# Patient Record
Sex: Female | Born: 1975 | Hispanic: No
Health system: Western US, Academic
[De-identification: ages and names within clinical notes are randomized; demographics above are authoritative.]

## PROBLEM LIST (undated history)

## (undated) DIAGNOSIS — D649 Anemia, unspecified: Secondary | ICD-10-CM

## (undated) DIAGNOSIS — M549 Dorsalgia, unspecified: Secondary | ICD-10-CM

## (undated) DIAGNOSIS — J45909 Unspecified asthma, uncomplicated: Secondary | ICD-10-CM

## (undated) DIAGNOSIS — I499 Cardiac arrhythmia, unspecified: Secondary | ICD-10-CM

## (undated) DIAGNOSIS — K219 Gastro-esophageal reflux disease without esophagitis: Secondary | ICD-10-CM

## (undated) HISTORY — PX: TUBAL LIGATION: SHX77

---

## 1998-01-23 ENCOUNTER — Emergency Department (HOSPITAL_COMMUNITY): Admission: EM | Admit: 1998-01-23 | Discharge: 1998-01-23 | Payer: Self-pay | Admitting: Emergency Medicine

## 1998-11-16 ENCOUNTER — Emergency Department (HOSPITAL_COMMUNITY): Admission: EM | Admit: 1998-11-16 | Discharge: 1998-11-17 | Payer: Self-pay | Admitting: Emergency Medicine

## 2002-05-22 ENCOUNTER — Emergency Department (HOSPITAL_COMMUNITY): Admission: EM | Admit: 2002-05-22 | Discharge: 2002-05-22 | Payer: Self-pay | Admitting: *Deleted

## 2002-07-26 ENCOUNTER — Emergency Department (HOSPITAL_COMMUNITY): Admission: EM | Admit: 2002-07-26 | Discharge: 2002-07-26 | Payer: Self-pay | Admitting: Emergency Medicine

## 2002-07-26 ENCOUNTER — Encounter: Payer: Self-pay | Admitting: Emergency Medicine

## 2002-08-07 ENCOUNTER — Emergency Department (HOSPITAL_COMMUNITY): Admission: EM | Admit: 2002-08-07 | Discharge: 2002-08-07 | Payer: Self-pay | Admitting: Emergency Medicine

## 2002-10-16 ENCOUNTER — Emergency Department (HOSPITAL_COMMUNITY): Admission: EM | Admit: 2002-10-16 | Discharge: 2002-10-16 | Payer: Self-pay | Admitting: Emergency Medicine

## 2002-12-10 ENCOUNTER — Emergency Department (HOSPITAL_COMMUNITY): Admission: EM | Admit: 2002-12-10 | Discharge: 2002-12-10 | Payer: Self-pay | Admitting: Emergency Medicine

## 2003-01-14 ENCOUNTER — Emergency Department (HOSPITAL_COMMUNITY): Admission: EM | Admit: 2003-01-14 | Discharge: 2003-01-14 | Payer: Self-pay | Admitting: Emergency Medicine

## 2003-08-01 ENCOUNTER — Emergency Department (HOSPITAL_COMMUNITY): Admission: EM | Admit: 2003-08-01 | Discharge: 2003-08-01 | Payer: Self-pay | Admitting: Emergency Medicine

## 2003-08-03 ENCOUNTER — Emergency Department (HOSPITAL_COMMUNITY): Admission: EM | Admit: 2003-08-03 | Discharge: 2003-08-03 | Payer: Self-pay | Admitting: Emergency Medicine

## 2003-10-09 ENCOUNTER — Encounter: Admission: RE | Admit: 2003-10-09 | Discharge: 2003-10-20 | Payer: Self-pay | Admitting: Orthopedic Surgery

## 2004-01-24 ENCOUNTER — Emergency Department (HOSPITAL_COMMUNITY): Admission: EM | Admit: 2004-01-24 | Discharge: 2004-01-24 | Payer: Self-pay | Admitting: Emergency Medicine

## 2004-11-04 ENCOUNTER — Emergency Department (HOSPITAL_COMMUNITY): Admission: EM | Admit: 2004-11-04 | Discharge: 2004-11-04 | Payer: Self-pay | Admitting: Emergency Medicine

## 2005-03-27 ENCOUNTER — Emergency Department (HOSPITAL_COMMUNITY): Admission: EM | Admit: 2005-03-27 | Discharge: 2005-03-27 | Payer: Self-pay | Admitting: Emergency Medicine

## 2005-04-06 ENCOUNTER — Emergency Department (HOSPITAL_COMMUNITY): Admission: EM | Admit: 2005-04-06 | Discharge: 2005-04-06 | Payer: Self-pay | Admitting: Emergency Medicine

## 2005-09-29 ENCOUNTER — Emergency Department (HOSPITAL_COMMUNITY): Admission: EM | Admit: 2005-09-29 | Discharge: 2005-09-29 | Payer: Self-pay | Admitting: Pediatrics

## 2006-03-03 ENCOUNTER — Emergency Department (HOSPITAL_COMMUNITY): Admission: EM | Admit: 2006-03-03 | Discharge: 2006-03-03 | Payer: Self-pay | Admitting: Emergency Medicine

## 2006-03-20 ENCOUNTER — Emergency Department (HOSPITAL_COMMUNITY): Admission: EM | Admit: 2006-03-20 | Discharge: 2006-03-20 | Payer: Self-pay | Admitting: Emergency Medicine

## 2007-01-13 ENCOUNTER — Emergency Department (HOSPITAL_COMMUNITY): Admission: EM | Admit: 2007-01-13 | Discharge: 2007-01-13 | Payer: Self-pay | Admitting: Emergency Medicine

## 2007-02-07 ENCOUNTER — Emergency Department (HOSPITAL_COMMUNITY): Admission: EM | Admit: 2007-02-07 | Discharge: 2007-02-07 | Payer: Self-pay | Admitting: Emergency Medicine

## 2008-01-18 ENCOUNTER — Emergency Department (HOSPITAL_COMMUNITY): Admission: EM | Admit: 2008-01-18 | Discharge: 2008-01-18 | Payer: Self-pay | Admitting: Emergency Medicine

## 2008-06-24 ENCOUNTER — Emergency Department (HOSPITAL_COMMUNITY): Admission: EM | Admit: 2008-06-24 | Discharge: 2008-06-24 | Payer: Self-pay | Admitting: Emergency Medicine

## 2008-07-29 ENCOUNTER — Emergency Department (HOSPITAL_COMMUNITY): Admission: EM | Admit: 2008-07-29 | Discharge: 2008-07-29 | Payer: Self-pay | Admitting: Emergency Medicine

## 2008-08-15 ENCOUNTER — Emergency Department (HOSPITAL_COMMUNITY): Admission: EM | Admit: 2008-08-15 | Discharge: 2008-08-15 | Payer: Self-pay | Admitting: Family Medicine

## 2009-03-21 ENCOUNTER — Emergency Department (HOSPITAL_COMMUNITY): Admission: EM | Admit: 2009-03-21 | Discharge: 2009-03-21 | Payer: Self-pay | Admitting: Emergency Medicine

## 2009-04-14 ENCOUNTER — Emergency Department (HOSPITAL_COMMUNITY): Admission: EM | Admit: 2009-04-14 | Discharge: 2009-04-14 | Payer: Self-pay | Admitting: Emergency Medicine

## 2009-04-19 ENCOUNTER — Emergency Department (HOSPITAL_COMMUNITY): Admission: EM | Admit: 2009-04-19 | Discharge: 2009-04-19 | Payer: Self-pay | Admitting: Emergency Medicine

## 2009-08-09 ENCOUNTER — Emergency Department (HOSPITAL_COMMUNITY): Admission: EM | Admit: 2009-08-09 | Discharge: 2009-08-09 | Payer: Self-pay | Admitting: Emergency Medicine

## 2010-06-02 ENCOUNTER — Emergency Department (HOSPITAL_COMMUNITY)
Admission: EM | Admit: 2010-06-02 | Discharge: 2010-06-02 | Payer: Self-pay | Source: Home / Self Care | Admitting: Emergency Medicine

## 2010-09-17 ENCOUNTER — Emergency Department (HOSPITAL_COMMUNITY)
Admission: EM | Admit: 2010-09-17 | Discharge: 2010-09-17 | Payer: Self-pay | Source: Home / Self Care | Admitting: Emergency Medicine

## 2010-11-02 ENCOUNTER — Emergency Department (HOSPITAL_COMMUNITY)
Admission: EM | Admit: 2010-11-02 | Discharge: 2010-11-02 | Disposition: A | Discharge status: Home / Self Care | Payer: Self-pay | Attending: Emergency Medicine | Admitting: Emergency Medicine

## 2010-11-02 DIAGNOSIS — J45901 Unspecified asthma with (acute) exacerbation: Secondary | ICD-10-CM | POA: Insufficient documentation

## 2010-11-02 DIAGNOSIS — R0989 Other specified symptoms and signs involving the circulatory and respiratory systems: Secondary | ICD-10-CM | POA: Insufficient documentation

## 2010-11-02 DIAGNOSIS — R0609 Other forms of dyspnea: Secondary | ICD-10-CM | POA: Insufficient documentation

## 2010-11-02 DIAGNOSIS — R0602 Shortness of breath: Secondary | ICD-10-CM | POA: Insufficient documentation

## 2010-11-22 ENCOUNTER — Emergency Department (HOSPITAL_COMMUNITY)
Admission: EM | Admit: 2010-11-22 | Discharge: 2010-11-22 | Disposition: A | Discharge status: Home / Self Care | Payer: Self-pay | Attending: Emergency Medicine | Admitting: Emergency Medicine

## 2010-11-22 DIAGNOSIS — J45909 Unspecified asthma, uncomplicated: Secondary | ICD-10-CM | POA: Insufficient documentation

## 2010-11-22 DIAGNOSIS — R0609 Other forms of dyspnea: Secondary | ICD-10-CM | POA: Insufficient documentation

## 2010-11-22 DIAGNOSIS — R0989 Other specified symptoms and signs involving the circulatory and respiratory systems: Secondary | ICD-10-CM | POA: Insufficient documentation

## 2010-11-22 DIAGNOSIS — R0789 Other chest pain: Secondary | ICD-10-CM | POA: Insufficient documentation

## 2010-11-23 LAB — COMPREHENSIVE METABOLIC PANEL
ALT: 14 U/L (ref 0–35)
AST: 21 U/L (ref 0–37)
Albumin: 3.3 g/dL — ABNORMAL LOW (ref 3.5–5.2)
BUN: 7 mg/dL (ref 6–23)
CO2: 31 mEq/L (ref 19–32)
Calcium: 8.5 mg/dL (ref 8.4–10.5)
Chloride: 103 mEq/L (ref 96–112)
Creatinine, Ser: 0.97 mg/dL (ref 0.4–1.2)
GFR calc Af Amer: >60 mL/min (ref >60)
Potassium: 3.7 mEq/L (ref 3.5–5.1)
Sodium: 140 mEq/L (ref 135–145)
Total Protein: 6.6 g/dL (ref 6.0–8.3)

## 2010-11-23 LAB — URINE MICROSCOPIC-ADD ON

## 2010-11-23 LAB — CBC
Platelets: 343 10*3/uL (ref 150–400)
RDW: 12.7 % (ref 11.5–15.5)
WBC: 6.1 10*3/uL (ref 4.0–10.5)

## 2010-11-23 LAB — URINALYSIS, ROUTINE W REFLEX MICROSCOPIC
Leukocytes, UA: NEGATIVE
Nitrite: NEGATIVE
Specific Gravity, Urine: 1.016 (ref 1.005–1.030)
pH: 6 (ref 5.0–8.0)

## 2010-11-23 LAB — DIFFERENTIAL
Basophils Relative: 1 % (ref 0–1)
Eosinophils Absolute: 0.4 10*3/uL (ref 0.0–0.7)
Lymphs Abs: 1.9 10*3/uL (ref 0.7–4.0)
Neutro Abs: 3.2 10*3/uL (ref 1.7–7.7)
Neutrophils Relative %: 52 % (ref 43–77)

## 2010-11-23 LAB — POCT PREGNANCY, URINE: Preg Test, Ur: NEGATIVE

## 2011-01-08 ENCOUNTER — Emergency Department (HOSPITAL_COMMUNITY)
Admission: EM | Admit: 2011-01-08 | Discharge: 2011-01-08 | Disposition: A | Discharge status: Home / Self Care | Payer: Self-pay | Attending: Emergency Medicine | Admitting: Emergency Medicine

## 2011-01-08 DIAGNOSIS — R05 Cough: Secondary | ICD-10-CM | POA: Insufficient documentation

## 2011-01-08 DIAGNOSIS — J45909 Unspecified asthma, uncomplicated: Secondary | ICD-10-CM | POA: Insufficient documentation

## 2011-01-08 DIAGNOSIS — R059 Cough, unspecified: Secondary | ICD-10-CM | POA: Insufficient documentation

## 2011-01-10 ENCOUNTER — Emergency Department (HOSPITAL_COMMUNITY): Payer: Self-pay

## 2011-01-10 ENCOUNTER — Emergency Department (HOSPITAL_COMMUNITY)
Admission: EM | Admit: 2011-01-10 | Discharge: 2011-01-10 | Disposition: A | Discharge status: Home / Self Care | Payer: Self-pay | Attending: Emergency Medicine | Admitting: Emergency Medicine

## 2011-01-10 DIAGNOSIS — R0602 Shortness of breath: Secondary | ICD-10-CM | POA: Insufficient documentation

## 2011-01-10 DIAGNOSIS — J45909 Unspecified asthma, uncomplicated: Secondary | ICD-10-CM | POA: Insufficient documentation

## 2011-02-03 ENCOUNTER — Emergency Department (HOSPITAL_COMMUNITY)
Admission: EM | Admit: 2011-02-03 | Discharge: 2011-02-03 | Disposition: A | Discharge status: Home / Self Care | Payer: Self-pay | Attending: Emergency Medicine | Admitting: Emergency Medicine

## 2011-02-03 DIAGNOSIS — J45901 Unspecified asthma with (acute) exacerbation: Secondary | ICD-10-CM | POA: Insufficient documentation

## 2011-02-23 ENCOUNTER — Emergency Department (HOSPITAL_COMMUNITY): Payer: Self-pay

## 2011-02-23 ENCOUNTER — Emergency Department (HOSPITAL_COMMUNITY)
Admission: EM | Admit: 2011-02-23 | Discharge: 2011-02-23 | Disposition: A | Discharge status: Home / Self Care | Payer: Self-pay | Attending: Emergency Medicine | Admitting: Emergency Medicine

## 2011-02-23 DIAGNOSIS — R0602 Shortness of breath: Secondary | ICD-10-CM | POA: Insufficient documentation

## 2011-02-23 DIAGNOSIS — J45909 Unspecified asthma, uncomplicated: Secondary | ICD-10-CM | POA: Insufficient documentation

## 2011-02-25 ENCOUNTER — Emergency Department (HOSPITAL_COMMUNITY)
Admission: EM | Admit: 2011-02-25 | Discharge: 2011-02-25 | Disposition: A | Discharge status: Home / Self Care | Payer: No Typology Code available for payment source | Attending: Emergency Medicine | Admitting: Emergency Medicine

## 2011-02-25 DIAGNOSIS — S1093XA Contusion of unspecified part of neck, initial encounter: Secondary | ICD-10-CM | POA: Insufficient documentation

## 2011-02-25 DIAGNOSIS — Y9269 Other specified industrial and construction area as the place of occurrence of the external cause: Secondary | ICD-10-CM | POA: Insufficient documentation

## 2011-02-25 DIAGNOSIS — T148XXA Other injury of unspecified body region, initial encounter: Secondary | ICD-10-CM | POA: Insufficient documentation

## 2011-02-25 DIAGNOSIS — S0003XA Contusion of scalp, initial encounter: Secondary | ICD-10-CM | POA: Insufficient documentation

## 2011-05-15 LAB — BASIC METABOLIC PANEL
BUN: 11
Creatinine, Ser: 0.9
GFR calc non Af Amer: >60
Glucose, Bld: 88
Potassium: 4

## 2011-05-15 LAB — CBC
Hemoglobin: 12.9
Platelets: 383
RBC: 4.05
RDW: 12.8

## 2011-05-15 LAB — URINALYSIS, ROUTINE W REFLEX MICROSCOPIC
Glucose, UA: NEGATIVE
Hgb urine dipstick: NEGATIVE
Nitrite: NEGATIVE
Specific Gravity, Urine: 1.025
pH: 5.5

## 2011-05-22 LAB — POCT I-STAT, CHEM 8
Calcium, Ion: 1.07 mmol/L — ABNORMAL LOW (ref 1.12–1.32)
Potassium: 3.5 mEq/L (ref 3.5–5.1)
Sodium: 142 mEq/L (ref 135–145)

## 2011-06-04 LAB — WET PREP, GENITAL
Trich, Wet Prep: NONE SEEN
WBC, Wet Prep HPF POC: NONE SEEN
Yeast Wet Prep HPF POC: NONE SEEN

## 2011-06-04 LAB — GC/CHLAMYDIA PROBE AMP, GENITAL
Chlamydia, DNA Probe: NEGATIVE
GC Probe Amp, Genital: NEGATIVE

## 2013-02-19 ENCOUNTER — Emergency Department (HOSPITAL_COMMUNITY)
Admission: EM | Admit: 2013-02-19 | Discharge: 2013-02-19 | Disposition: A | Discharge status: Home / Self Care | Payer: Self-pay | Attending: Emergency Medicine | Admitting: Emergency Medicine

## 2013-02-19 ENCOUNTER — Encounter (HOSPITAL_COMMUNITY): Payer: Self-pay | Admitting: *Deleted

## 2013-02-19 DIAGNOSIS — J45901 Unspecified asthma with (acute) exacerbation: Secondary | ICD-10-CM | POA: Insufficient documentation

## 2013-02-19 DIAGNOSIS — R05 Cough: Secondary | ICD-10-CM | POA: Insufficient documentation

## 2013-02-19 DIAGNOSIS — Z880 Allergy status to penicillin: Secondary | ICD-10-CM | POA: Insufficient documentation

## 2013-02-19 DIAGNOSIS — Z79899 Other long term (current) drug therapy: Secondary | ICD-10-CM | POA: Insufficient documentation

## 2013-02-19 DIAGNOSIS — R059 Cough, unspecified: Secondary | ICD-10-CM | POA: Insufficient documentation

## 2013-02-19 DIAGNOSIS — R110 Nausea: Secondary | ICD-10-CM | POA: Insufficient documentation

## 2013-02-19 HISTORY — DX: Unspecified asthma, uncomplicated: J45.909

## 2013-02-19 MED ORDER — DEXAMETHASONE SODIUM PHOSPHATE 10 MG/ML IJ SOLN
10.0000 mg | Freq: Once | INTRAMUSCULAR | Status: DC
  Filled 2013-02-19: qty 1

## 2013-02-19 MED ORDER — IPRATROPIUM BROMIDE 0.02 % IN SOLN
0.5000 mg | Freq: Once | RESPIRATORY_TRACT | Status: AC
  Administered 2013-02-19: 0.5 mg via RESPIRATORY_TRACT
  Filled 2013-02-19: qty 2.5

## 2013-02-19 MED ORDER — ALBUTEROL SULFATE HFA 108 (90 BASE) MCG/ACT IN AERS: 2.0000 | INHALATION_SPRAY | RESPIRATORY_TRACT | Status: DC | PRN

## 2013-02-19 MED ORDER — DEXAMETHASONE SODIUM PHOSPHATE 10 MG/ML IJ SOLN
10.0000 mg | Freq: Once | INTRAMUSCULAR | Status: AC
  Administered 2013-02-19: 10 mg via INTRAMUSCULAR

## 2013-02-19 MED ORDER — ALBUTEROL SULFATE (5 MG/ML) 0.5% IN NEBU
5.0000 mg | INHALATION_SOLUTION | Freq: Once | RESPIRATORY_TRACT | Status: AC
  Administered 2013-02-19: 5 mg via RESPIRATORY_TRACT
  Filled 2013-02-19: qty 1

## 2013-02-19 MED ORDER — ALBUTEROL SULFATE HFA 108 (90 BASE) MCG/ACT IN AERS
2.0000 | INHALATION_SPRAY | Freq: Once | RESPIRATORY_TRACT | Status: AC
  Administered 2013-02-19: 2 via RESPIRATORY_TRACT
  Filled 2013-02-19: qty 6.7

## 2013-02-19 NOTE — ED Provider Notes (Signed)
I saw and evaluated the patient, reviewed the resident's note and I agree with the findings and plan.   .Face to face Exam:  General:  Awake HEENT:  Atraumatic Resp:  Normal effort Abd:  Nondistended Neuro:No focal weakness   Nelia Shi, MD 02/19/13 [phone]

## 2013-02-19 NOTE — ED Notes (Signed)
Pt told RRT she was feeling better after breathing treatment.  Pt laughing and talking in full sentences with NAD noted.  Upon MD arrival to room pt states she feels tight in her chest.

## 2013-02-19 NOTE — ED Provider Notes (Signed)
History    CSN: [id number] Arrival date & time 02/19/13  7829  First MD Initiated Contact with Patient 02/19/13 [phone]     Chief Complaint  Patient presents with  . Shortness of Breath   (Consider location/radiation/quality/duration/timing/severity/associated sxs/prior Treatment) HPI Comments: Pt is a 37yo female with hx of asthma who presents with about 24h of cough, SOB, and wheezing after cleaning a dusty/dirty house with "lots of mold" yesterday. Pt also went swimming yesterday which 'stressed her lungs.' Pt reports onset of cough with worsening shortness of breath through the day yesterday and last night and states she was unable to sleep. Cough has had no production of sputum and pt has had no fevers or chest pain. Pt does endorse mild nausea. Pt has tried some deep breathing exercises without much relief. Pt does not have any inhalers at home; she last had one about a month ago but moved back to [place] after living in [place] (where she had insurance). She has used albuterol with relief in the past.  Patient is a 37 y.o. female presenting with shortness of breath. The history is provided by the patient. No language interpreter was used.  Shortness of Breath Severity:  Moderate Onset quality:  Sudden Duration:  1 day Timing:  Constant Progression:  Worsening Chronicity:  Recurrent Context: activity and occupational exposure   Context comment:  Cleaning dirty/dusty house with lots of mold yesterday Relieved by:  Rest Worsened by:  Exertion and activity Ineffective treatments:  Sitting up and lying down Associated symptoms: cough and wheezing   Associated symptoms: no abdominal pain, no chest pain, no fever, no rash, no sore throat, no sputum production and no vomiting   Cough:    Cough characteristics:  Non-productive   Sputum characteristics:  Nondescript   Severity:  Moderate Risk factors comment:  Hx of asthma  Past Medical History  Diagnosis Date  . Asthma     History reviewed. No pertinent past surgical history. No family history on file. History  Substance Use Topics  . Smoking status: Never Smoker   . Smokeless tobacco: Not on file  . Alcohol Use: Yes   OB History   Grav Para Term Preterm Abortions TAB SAB Ect Mult Living                 Review of Systems  Constitutional: Negative for fever, chills and activity change.  HENT: Negative for congestion, sore throat, rhinorrhea and sneezing.   Respiratory: Positive for cough, shortness of breath and wheezing. Negative for sputum production.   Cardiovascular: Negative for chest pain.  Gastrointestinal: Positive for nausea. Negative for vomiting, abdominal pain and diarrhea.  Genitourinary: Negative for dysuria, urgency and frequency.  Skin: Negative for rash.  Neurological: Negative for dizziness and weakness.    Allergies  Penicillins  Home Medications   Current Outpatient Rx  Name  Route  Sig  Dispense  Refill  . albuterol (PROVENTIL HFA;VENTOLIN HFA) 108 (90 BASE) MCG/ACT inhaler   Inhalation   Inhale 2 puffs into the lungs every 6 (six) hours as needed for wheezing.         Marland Kitchen albuterol (PROVENTIL HFA;VENTOLIN HFA) 108 (90 BASE) MCG/ACT inhaler   Inhalation   Inhale 2 puffs into the lungs every 4 (four) hours as needed for wheezing or shortness of breath.   1 Inhaler   1    BP 113/71  Pulse 77  Temp(Src) 99 F (37.2 C)  Resp 22  SpO2 100%  LMP 02/19/2013  Physical Exam  Nursing note and vitals reviewed. Constitutional: She is oriented to person, place, and time. She appears well-developed and well-nourished. She is cooperative.  Non-toxic appearance. No distress.  HENT:  Head: Normocephalic and atraumatic.  Nose: No rhinorrhea or septal deviation.  Mouth/Throat: Uvula is midline and oropharynx is clear and moist.  Eyes: Conjunctivae are normal. Pupils are equal, round, and reactive to light.  Neck: Normal range of motion. Neck supple.  Cardiovascular: Normal  rate, regular rhythm and normal heart sounds.   No murmur heard. Heart sounds distant due to body habitus  Pulmonary/Chest: Effort normal. She has wheezes. She has no rales. She exhibits no tenderness.  Diffuse soft wheeze and protracted expiratory phase  Abdominal: Soft. Bowel sounds are normal. She exhibits no distension. There is no tenderness.  obese  Musculoskeletal: Normal range of motion. She exhibits edema.  Trace-1+, left > right at ankles  Neurological: She is alert and oriented to person, place, and time.  Skin: Skin is warm and dry. She is not diaphoretic.  Psychiatric: She has a normal mood and affect. Her behavior is normal.    ED Course  Procedures (including critical care time)   Date: 02/19/2013  Rate: 69  Rhythm: normal sinus rhythm  QRS Axis: normal  Intervals: normal  ST/T Wave abnormalities: normal  Conduction Disutrbances: none  Narrative Interpretation: normal EKG  Old EKG Reviewed: No prior tracing   0730: Mild/moderate wheezes but equal breath sounds/air movement bilaterally. Doubt PNA so will defer CXR for now. Will treat with Duoneb and Decadron 10 mg IM x1, then reassess.  1610: Some improvement of wheeze objectively after breathing treatment. Complaining of some chest tightness but EKG normal and O2 sat remains >97% on RA, no increased respiratory effort. Repeat Duoneb about 45 minutes after first.  0845: Much subjective improvement s/p Duoneb x2. Exam with cleared lung sounds and improved bilateral air movement.  Labs Reviewed - No data to display No results found. 1. Asthma with exacerbation, unspecified asthma severity     MDM  37yo female with history of asthma, now with mild exacerbation. Treated with Duoneb x 2 and Decadron IM with good relief. Discharged with Rx for albuterol inhaler and instructions to establish care with a primary care physician for long-term management. Red flags that would prompt return to the ED reviewed.  The above  was discussed in its entirety with attending ED physician Dr. Radford Pax.   Bobbye Morton, MD  PGY-2, [facility]  Bobbye Morton, MD 02/19/13 [phone]

## 2013-02-19 NOTE — ED Notes (Signed)
The pt has had diff breathing  Since yesterday when she was cleaning a dusty house.  She has not been able to sleep  Due to her diff breathing all night,  Speaking in full sentences

## 2013-05-22 ENCOUNTER — Emergency Department (HOSPITAL_COMMUNITY): Payer: No Typology Code available for payment source

## 2013-05-22 ENCOUNTER — Emergency Department (HOSPITAL_COMMUNITY)
Admission: EM | Admit: 2013-05-22 | Discharge: 2013-05-22 | Disposition: A | Discharge status: Home / Self Care | Payer: No Typology Code available for payment source | Attending: Emergency Medicine | Admitting: Emergency Medicine

## 2013-05-22 ENCOUNTER — Encounter (HOSPITAL_COMMUNITY): Payer: Self-pay | Admitting: *Deleted

## 2013-05-22 DIAGNOSIS — Z880 Allergy status to penicillin: Secondary | ICD-10-CM | POA: Insufficient documentation

## 2013-05-22 DIAGNOSIS — Z79899 Other long term (current) drug therapy: Secondary | ICD-10-CM | POA: Insufficient documentation

## 2013-05-22 DIAGNOSIS — J45901 Unspecified asthma with (acute) exacerbation: Secondary | ICD-10-CM

## 2013-05-22 DIAGNOSIS — J069 Acute upper respiratory infection, unspecified: Secondary | ICD-10-CM | POA: Insufficient documentation

## 2013-05-22 MED ORDER — ALBUTEROL SULFATE (5 MG/ML) 0.5% IN NEBU
2.5000 mg | INHALATION_SOLUTION | Freq: Once | RESPIRATORY_TRACT | Status: AC
  Administered 2013-05-22: 2.5 mg via RESPIRATORY_TRACT
  Filled 2013-05-22: qty 0.5

## 2013-05-22 MED ORDER — IPRATROPIUM BROMIDE 0.02 % IN SOLN
0.5000 mg | Freq: Once | RESPIRATORY_TRACT | Status: AC
  Administered 2013-05-22: 0.5 mg via RESPIRATORY_TRACT
  Filled 2013-05-22: qty 2.5

## 2013-05-22 MED ORDER — ALBUTEROL SULFATE HFA 108 (90 BASE) MCG/ACT IN AERS
2.0000 | INHALATION_SPRAY | RESPIRATORY_TRACT | Status: DC | PRN
  Administered 2013-05-22: 2 via RESPIRATORY_TRACT
  Filled 2013-05-22: qty 6.7

## 2013-05-22 NOTE — ED Provider Notes (Signed)
CSN: [id number]     Arrival date & time 05/22/13  2044 History   First MD Initiated Contact with Patient 05/22/13 2121     Chief Complaint  Patient presents with  . Shortness of Breath   (Consider location/radiation/quality/duration/timing/severity/associated sxs/prior Treatment) HPI Pt is a 37yo morbidly obese female with hx of asthma c/o gradually worsening SOB associated with URI symptoms x1 week. Reports moderate productive cough with green phlegm.  States she also has noticed increasing chest tightness and SOB.  Reports trying home remedies such as hot showers and children's cough medicine w/o relief.  Has been out of her albuterol inhaler for 4 weeks due to lack of insurance.  Reports she has environmental allergies including grass and pollen that trigger her asthma.  Smoke is another common trigger, which she states she has notice as of recent more people smoking.  States she "just needs some oxygen" and thinks she will be okay.  Reports subjective fever. Denies n/v/d. No sick contacts.  Was recently in [place] and [place] where there was more air pollution. Denies chest pain.   Past Medical History  Diagnosis Date  . Asthma    History reviewed. No pertinent past surgical history. No family history on file. History  Substance Use Topics  . Smoking status: Never Smoker   . Smokeless tobacco: Not on file  . Alcohol Use: Yes   OB History   Grav Para Term Preterm Abortions TAB SAB Ect Mult Living                 Review of Systems  Constitutional: Positive for fever ( "subjective"). Negative for chills, diaphoresis and fatigue.  HENT: Positive for congestion and sore throat.   Respiratory: Positive for cough, chest tightness, shortness of breath and wheezing. Negative for stridor.   Cardiovascular: Negative for chest pain.  Gastrointestinal: Negative for nausea, vomiting and abdominal pain.  All other systems reviewed and are negative.    Allergies  Penicillins  Home Medications    Current Outpatient Rx  Name  Route  Sig  Dispense  Refill  . albuterol (PROVENTIL HFA;VENTOLIN HFA) 108 (90 BASE) MCG/ACT inhaler   Inhalation   Inhale 2 puffs into the lungs every 4 (four) hours as needed for wheezing or shortness of breath.   1 Inhaler   1    BP 129/77  Pulse 81  Temp(Src) 98.5 F (36.9 C) (Oral)  Resp 19  Wt 295 lb (133.811 kg)  SpO2 99%  LMP 04/22/2013 Physical Exam  Nursing note and vitals reviewed. Constitutional: She appears well-developed and well-nourished. No distress.  Morbidly obese female sitting up in exam bed, NAD.  HENT:  Head: Normocephalic and atraumatic.  Eyes: Conjunctivae are normal. No scleral icterus.  Neck: Normal range of motion.  Cardiovascular: Normal rate, regular rhythm and normal heart sounds.   Pulmonary/Chest: Effort normal. No respiratory distress. She has wheezes ( mild diffuse bibasilar wheeze). She has no rales. She exhibits no tenderness.  Mild diffuse wheeze. No respiratory distress. No accessory muscle use. Able to speak in full sentences w/o difficulty.  Abdominal: Soft. Bowel sounds are normal. She exhibits no distension and no mass. There is no tenderness. There is no rebound and no guarding.  Musculoskeletal: Normal range of motion.  Neurological: She is alert.  Skin: Skin is warm and dry. She is not diaphoretic.    ED Course  Procedures (including critical care time) Labs Review Labs Reviewed - No data to display Imaging Review Dg Chest 2  View  05/22/2013   *RADIOLOGY REPORT*  Clinical Data: Worsening shortness of breath and chest congestion.  CHEST - 2 VIEW  Comparison: Chest radiograph performed 02/23/2011  Findings: The lungs are well-aerated and clear.  There is no evidence of focal opacification, pleural effusion or pneumothorax.  The heart is normal in size; the mediastinal contour is within normal limits.  No acute osseous abnormalities are seen.  IMPRESSION: No acute cardiopulmonary process seen.    Original Report Authenticated By: Tonia Ghent, M.D.    MDM   1. Asthma exacerbation   2. URI (upper respiratory infection)    Pt presenting with URI symptoms and asthma exacerbation w/o respiratory distress. No accessory muscle use. Will get CXR and give albuterol and atrovent neb tx then reassess. Vitals: WNL, afebrile, O2 99%  CXR: no acute cardiopulmonary findings  Pt states she feels better after neb tx. Lungs: CTAB.  Rx: albuterol inhaler. Return precautions provided. Pt verbalized understanding and agreement with tx plan.     Junius Finner, PA-C 05/23/13 [phone]

## 2013-05-22 NOTE — ED Notes (Signed)
The pt has asthma and  For 4 weeks she has been out of her albuterol inhaler.  She has a cold and her breathing has been worse.  She has chest congestion also.  No respiratory distress

## 2013-05-23 NOTE — ED Provider Notes (Signed)
Medical screening examination/treatment/procedure(s) were performed by non-physician practitioner and as supervising physician I was immediately available for consultation/collaboration.   William Rykar Lebleu, MD 05/23/13 1840 

## 2013-09-13 ENCOUNTER — Encounter (HOSPITAL_COMMUNITY): Payer: Self-pay | Admitting: Emergency Medicine

## 2013-09-13 ENCOUNTER — Emergency Department (HOSPITAL_COMMUNITY)
Admission: EM | Admit: 2013-09-13 | Discharge: 2013-09-13 | Disposition: A | Discharge status: Home / Self Care | Payer: Self-pay | Attending: Emergency Medicine | Admitting: Emergency Medicine

## 2013-09-13 ENCOUNTER — Emergency Department (HOSPITAL_COMMUNITY): Payer: Self-pay

## 2013-09-13 DIAGNOSIS — N390 Urinary tract infection, site not specified: Secondary | ICD-10-CM | POA: Insufficient documentation

## 2013-09-13 DIAGNOSIS — Z3202 Encounter for pregnancy test, result negative: Secondary | ICD-10-CM | POA: Insufficient documentation

## 2013-09-13 DIAGNOSIS — J45901 Unspecified asthma with (acute) exacerbation: Secondary | ICD-10-CM | POA: Insufficient documentation

## 2013-09-13 DIAGNOSIS — Z880 Allergy status to penicillin: Secondary | ICD-10-CM | POA: Insufficient documentation

## 2013-09-13 DIAGNOSIS — B9789 Other viral agents as the cause of diseases classified elsewhere: Secondary | ICD-10-CM

## 2013-09-13 DIAGNOSIS — Z79899 Other long term (current) drug therapy: Secondary | ICD-10-CM | POA: Insufficient documentation

## 2013-09-13 DIAGNOSIS — J069 Acute upper respiratory infection, unspecified: Secondary | ICD-10-CM | POA: Insufficient documentation

## 2013-09-13 LAB — CBC WITH DIFFERENTIAL/PLATELET
BASOS ABS: 0 10*3/uL (ref 0.0–0.1)
Basophils Relative: 0 % (ref 0–1)
EOS PCT: 4 % (ref 0–5)
Eosinophils Absolute: 0.2 10*3/uL (ref 0.0–0.7)
HCT: 28.6 % — ABNORMAL LOW (ref 36.0–46.0)
Hemoglobin: 9.1 g/dL — ABNORMAL LOW (ref 12.0–15.0)
LYMPHS ABS: 0.7 10*3/uL (ref 0.7–4.0)
LYMPHS PCT: 11 % — AB (ref 12–46)
MCH: 25.6 pg — ABNORMAL LOW (ref 26.0–34.0)
MCHC: 31.8 g/dL (ref 30.0–36.0)
MCV: 80.3 fL (ref 78.0–100.0)
Monocytes Absolute: 0.9 10*3/uL (ref 0.1–1.0)
Monocytes Relative: 16 % — ABNORMAL HIGH (ref 3–12)
NEUTROS PCT: 69 % (ref 43–77)
Neutro Abs: 4.1 10*3/uL (ref 1.7–7.7)
PLATELETS: 385 10*3/uL (ref 150–400)
RBC: 3.56 MIL/uL — AB (ref 3.87–5.11)
RDW: 14.9 % (ref 11.5–15.5)
WBC: 5.9 10*3/uL (ref 4.0–10.5)

## 2013-09-13 LAB — COMPREHENSIVE METABOLIC PANEL
ALK PHOS: 76 U/L (ref 39–117)
ALT: 12 U/L (ref 0–35)
AST: 15 U/L (ref 0–37)
Albumin: 3.2 g/dL — ABNORMAL LOW (ref 3.5–5.2)
BUN: 6 mg/dL (ref 6–23)
CALCIUM: 8.3 mg/dL — AB (ref 8.4–10.5)
CO2: 24 meq/L (ref 19–32)
Chloride: 100 mEq/L (ref 96–112)
Creatinine, Ser: 0.94 mg/dL (ref 0.50–1.10)
GFR calc Af Amer: 87 mL/min — ABNORMAL LOW (ref >90)
GFR, EST NON AFRICAN AMERICAN: 75 mL/min — AB (ref >90)
GLUCOSE: 105 mg/dL — AB (ref 70–99)
POTASSIUM: 3.9 meq/L (ref 3.7–5.3)
SODIUM: 138 meq/L (ref 137–147)
TOTAL PROTEIN: 7.3 g/dL (ref 6.0–8.3)
Total Bilirubin: 0.3 mg/dL (ref 0.3–1.2)

## 2013-09-13 LAB — URINALYSIS, ROUTINE W REFLEX MICROSCOPIC
Glucose, UA: NEGATIVE mg/dL
KETONES UR: 40 mg/dL — AB
Nitrite: POSITIVE — AB
PH: 5 (ref 5.0–8.0)
Protein, ur: 100 mg/dL — AB
SPECIFIC GRAVITY, URINE: 1.026 (ref 1.005–1.030)
Urobilinogen, UA: 1 mg/dL (ref 0.0–1.0)

## 2013-09-13 LAB — URINE MICROSCOPIC-ADD ON

## 2013-09-13 LAB — POCT PREGNANCY, URINE: Preg Test, Ur: NEGATIVE

## 2013-09-13 MED ORDER — ACETAMINOPHEN 325 MG PO TABS
650.0000 mg | ORAL_TABLET | Freq: Once | ORAL | Status: AC
  Administered 2013-09-13: 650 mg via ORAL
  Filled 2013-09-13: qty 2

## 2013-09-13 MED ORDER — ALBUTEROL SULFATE (2.5 MG/3ML) 0.083% IN NEBU
5.0000 mg | INHALATION_SOLUTION | Freq: Once | RESPIRATORY_TRACT | Status: AC
  Administered 2013-09-13: 5 mg via RESPIRATORY_TRACT
  Filled 2013-09-13 (×2): qty 6

## 2013-09-13 MED ORDER — OSELTAMIVIR PHOSPHATE 75 MG PO CAPS: 75.0000 mg | ORAL_CAPSULE | Freq: Two times a day (BID) | ORAL | Status: DC

## 2013-09-13 MED ORDER — BENZONATATE 100 MG PO CAPS: 100.0000 mg | ORAL_CAPSULE | Freq: Three times a day (TID) | ORAL | Status: DC

## 2013-09-13 MED ORDER — ALBUTEROL SULFATE HFA 108 (90 BASE) MCG/ACT IN AERS
2.0000 | INHALATION_SPRAY | Freq: Once | RESPIRATORY_TRACT | Status: AC
  Administered 2013-09-13: 2 via RESPIRATORY_TRACT
  Filled 2013-09-13: qty 6.7

## 2013-09-13 MED ORDER — OSELTAMIVIR PHOSPHATE 75 MG PO CAPS
75.0000 mg | ORAL_CAPSULE | Freq: Once | ORAL | Status: AC
  Administered 2013-09-13: 75 mg via ORAL
  Filled 2013-09-13: qty 1

## 2013-09-13 MED ORDER — IPRATROPIUM BROMIDE 0.02 % IN SOLN
0.5000 mg | Freq: Once | RESPIRATORY_TRACT | Status: AC
  Administered 2013-09-13: 0.5 mg via RESPIRATORY_TRACT
  Filled 2013-09-13 (×2): qty 2.5

## 2013-09-13 MED ORDER — ALBUTEROL SULFATE HFA 108 (90 BASE) MCG/ACT IN AERS: 1.0000 | INHALATION_SPRAY | Freq: Four times a day (QID) | RESPIRATORY_TRACT | Status: DC | PRN

## 2013-09-13 MED ORDER — SULFAMETHOXAZOLE-TRIMETHOPRIM 800-160 MG PO TABS: 1.0000 | ORAL_TABLET | Freq: Two times a day (BID) | ORAL | Status: DC

## 2013-09-13 MED ORDER — SULFAMETHOXAZOLE-TMP DS 800-160 MG PO TABS
1.0000 | ORAL_TABLET | Freq: Once | ORAL | Status: AC
  Administered 2013-09-13: 1 via ORAL
  Filled 2013-09-13: qty 1

## 2013-09-13 NOTE — ED Notes (Signed)
Pt reports fever, productive cough with green colored symptoms, and asthma exacerbation starting yesterday. Pt states she is out of her albuterol.

## 2013-09-13 NOTE — ED Provider Notes (Signed)
CSN: [id number]     Arrival date & time 09/13/13  1121 History   First MD Initiated Contact with Patient 09/13/13 1524     Chief Complaint  Patient presents with  . Asthma  . Abdominal Pain   (Consider location/radiation/quality/duration/timing/severity/associated sxs/prior Treatment) The history is provided by the patient and medical records.   This is a 38 y.o. F with PMH significant for asthma presenting to the ED for URI type sx, sudden onset 24 hours ago.  Specifically pt has had productive cough with thick green sputum, fever, chills, sore throat, headache, rhinorrhea, and congestion.  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  Pt did not receive her flu shot this year.  Denies any chest pain, palpitations, dizziness, weakness, or diaphoresis.    Pt states she normally uses albuterol inhaler for her asthma but ran out several weeks ago.  Pt also complains of some lower abdominal cramping.  Pt states she has a hx of fibroids and started her menstrual cycle this morning.  States cycle usually lasts approx 14 days and this cramping is normal for her.  Pt does not have an OB-GYN in the area.  Notes urine has been somewhat darker in color than normal but denies dysuria, urinary frequency, or odor. No vaginal discharge or pelvic pain. Mildly afebrile on arrival, VS otherwise stable.  Past Medical History  Diagnosis Date  . Asthma    History reviewed. No pertinent past surgical history. History reviewed. No pertinent family history. History  Substance Use Topics  . Smoking status: Never Smoker   . Smokeless tobacco: Not on file  . Alcohol Use: Yes   OB History   Grav Para Term Preterm Abortions TAB SAB Ect Mult Living                 Review of Systems  Constitutional: Positive for fever and chills.  HENT: Positive for congestion, postnasal drip, rhinorrhea, sinus pressure and sore throat.    Respiratory: Positive for shortness of breath and wheezing.   Genitourinary: Positive for vaginal bleeding.       Menstrual cramping  All other systems reviewed and are negative.    Allergies  Penicillins  Home Medications   Current Outpatient Rx  Name  Route  Sig  Dispense  Refill  . albuterol (PROVENTIL HFA;VENTOLIN HFA) 108 (90 BASE) MCG/ACT inhaler   Inhalation   Inhale 2 puffs into the lungs every 4 (four) hours as needed for wheezing or shortness of breath.   1 Inhaler   1    BP 124/83  Pulse 97  Temp(Src) 102.1 F (38.9 C) (Oral)  Resp 20  Ht 5\' 5"  (1.651 m)  Wt 307 lb 8 oz (139.481 kg)  BMI 51.17 kg/m2  SpO2 100%  LMP 09/13/2013  Physical Exam  Nursing note and vitals reviewed. Constitutional: She is oriented to person, place, and time. She appears well-developed and well-nourished. No distress.  Morbidly obese  HENT:  Head: Normocephalic and atraumatic.  Right Ear: Tympanic membrane and ear canal normal.  Left Ear: Tympanic membrane and ear canal normal.  Nose: Rhinorrhea present.  Mouth/Throat: Uvula is midline, oropharynx is clear and moist and mucous membranes are normal. No oropharyngeal exudate, posterior oropharyngeal edema, posterior oropharyngeal erythema or tonsillar abscesses.  Clear rhinorrhea bilateral nares; PND noted in oropharynx; tonsils normal in appearance bilaterally without exudate; uvula midline; no peritonsillar abscess  Eyes: Conjunctivae and EOM are normal. Pupils are equal, round, and reactive to light.  Neck: Normal range of motion.  Cardiovascular: Normal rate, regular rhythm and normal heart sounds.   Pulmonary/Chest: Effort normal. No accessory muscle usage. Not tachypneic. No respiratory distress. She has no decreased breath sounds. She has wheezes.  Diffuse end expiratory wheezes throughout; normal work of breathing without accessory muscle use; speaking in full complete sentences without difficulty  Abdominal: Soft. Bowel  sounds are normal. There is no tenderness. There is no guarding.  Musculoskeletal: Normal range of motion.  Neurological: She is alert and oriented to person, place, and time.  Skin: Skin is warm and dry. She is not diaphoretic.  Psychiatric: She has a normal mood and affect.    ED Course  Procedures (including critical care time) Labs Review Labs Reviewed  CBC WITH DIFFERENTIAL - Abnormal; Notable for the following:    RBC 3.56 (*)    Hemoglobin 9.1 (*)    HCT 28.6 (*)    MCH 25.6 (*)    Lymphocytes Relative 11 (*)    Monocytes Relative 16 (*)    All other components within normal limits  COMPREHENSIVE METABOLIC PANEL - Abnormal; Notable for the following:    Glucose, Bld 105 (*)    Calcium 8.3 (*)    Albumin 3.2 (*)    GFR calc non Af Amer 75 (*)    GFR calc Af Amer 87 (*)    All other components within normal limits  URINALYSIS, ROUTINE W REFLEX MICROSCOPIC   Imaging Review Dg Chest 2 View  09/13/2013   CLINICAL DATA:  Upper respiratory infection. Productive cough with fever.  EXAM: CHEST  2 VIEW  COMPARISON:  DG CHEST 2 VIEW dated 05/22/2013  FINDINGS: Cardiopericardial silhouette within normal limits. Mediastinal contours normal. Trachea midline. No airspace disease or effusion.  IMPRESSION: No active cardiopulmonary disease.   Electronically Signed   By: Dereck Ligas M.D.   On: 09/13/2013 16:52    EKG Interpretation   None       MDM   1. UTI (lower urinary tract infection)   2. Viral URI with cough    Pt with sudden onset of flu like sx.  Labs reassuring.  Pt with end expiratory wheezes throughout without respiratory distress.  No chest pain.  Will give albuterol/atroven neb and obtain CXR.  Abdominal cramping correlating with current menstrual cycle, abdominal exam is benign and non-surgical.  Will reassess.  Breathing improved following neb tx, pt states she feels better.  U/a nitrite +, culture pending.  Pt afebrile, non-toxic appearing, NAD, VS stable- ok for  discharge.  Will start on short course bactrim for UTI.  Rx tamiflu, albuterol inhaler, and tessalon perles.  Pt will FU with [facility] if problems occur.  Discussed plan with pt, she agreed.  Return precautions advised.  Larene Pickett, PA-C 09/13/13 2045

## 2013-09-13 NOTE — Discharge Instructions (Signed)
Take the prescribed medication as directed.  Symptoms may last another 7-10 days, this is normal.  Drink plenty of fluids and rest. Urine has been sent for culture-- if found to be resistant to your antibiotic, you will be contacted Follow-up with the [facility] if problems occur. Return to the ED for new or worsening symptoms.

## 2013-09-13 NOTE — ED Provider Notes (Signed)
Medical screening examination/treatment/procedure(s) were performed by non-physician practitioner and as supervising physician I was immediately available for consultation/collaboration.  Richarda Blade, MD 09/13/13 [phone]

## 2013-09-13 NOTE — ED Notes (Signed)
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

## 2013-09-13 NOTE — ED Notes (Signed)
MD at bedside. 

## 2014-02-27 ENCOUNTER — Emergency Department (HOSPITAL_COMMUNITY)
Admission: EM | Admit: 2014-02-27 | Discharge: 2014-02-27 | Discharge status: Against Medical Advice | Payer: No Typology Code available for payment source | Attending: Emergency Medicine | Admitting: Emergency Medicine

## 2014-02-27 ENCOUNTER — Emergency Department (HOSPITAL_COMMUNITY): Payer: No Typology Code available for payment source

## 2014-02-27 ENCOUNTER — Emergency Department (HOSPITAL_COMMUNITY)
Admission: EM | Admit: 2014-02-27 | Discharge: 2014-02-27 | Disposition: A | Discharge status: Home / Self Care | Payer: No Typology Code available for payment source | Attending: Emergency Medicine | Admitting: Emergency Medicine

## 2014-02-27 ENCOUNTER — Encounter (HOSPITAL_COMMUNITY): Payer: Self-pay | Admitting: Emergency Medicine

## 2014-02-27 DIAGNOSIS — S8990XA Unspecified injury of unspecified lower leg, initial encounter: Secondary | ICD-10-CM | POA: Insufficient documentation

## 2014-02-27 DIAGNOSIS — Z79899 Other long term (current) drug therapy: Secondary | ICD-10-CM | POA: Insufficient documentation

## 2014-02-27 DIAGNOSIS — J45909 Unspecified asthma, uncomplicated: Secondary | ICD-10-CM | POA: Insufficient documentation

## 2014-02-27 DIAGNOSIS — Z5320 Procedure and treatment not carried out because of patient's decision for unspecified reasons: Secondary | ICD-10-CM | POA: Insufficient documentation

## 2014-02-27 DIAGNOSIS — X500XXA Overexertion from strenuous movement or load, initial encounter: Secondary | ICD-10-CM | POA: Insufficient documentation

## 2014-02-27 DIAGNOSIS — M25571 Pain in right ankle and joints of right foot: Secondary | ICD-10-CM

## 2014-02-27 DIAGNOSIS — Y929 Unspecified place or not applicable: Secondary | ICD-10-CM | POA: Insufficient documentation

## 2014-02-27 DIAGNOSIS — S99929A Unspecified injury of unspecified foot, initial encounter: Principal | ICD-10-CM

## 2014-02-27 DIAGNOSIS — S99919A Unspecified injury of unspecified ankle, initial encounter: Principal | ICD-10-CM

## 2014-02-27 DIAGNOSIS — Z880 Allergy status to penicillin: Secondary | ICD-10-CM | POA: Insufficient documentation

## 2014-02-27 DIAGNOSIS — Y939 Activity, unspecified: Secondary | ICD-10-CM | POA: Insufficient documentation

## 2014-02-27 MED ORDER — ACETAMINOPHEN 500 MG PO TABS
1000.0000 mg | ORAL_TABLET | Freq: Once | ORAL | Status: AC
  Administered 2014-02-27: 1000 mg via ORAL
  Filled 2014-02-27: qty 2

## 2014-02-27 MED ORDER — HYDROCODONE-ACETAMINOPHEN 5-325 MG PO TABS
1.0000 | ORAL_TABLET | Freq: Four times a day (QID) | ORAL | Status: DC | PRN
Start: 2014-02-27 — End: 2014-04-13

## 2014-02-27 MED ORDER — ALBUTEROL SULFATE HFA 108 (90 BASE) MCG/ACT IN AERS
2.0000 | INHALATION_SPRAY | RESPIRATORY_TRACT | Status: DC | PRN
Start: 2014-02-27 — End: 2014-02-27
  Administered 2014-02-27: 2 via RESPIRATORY_TRACT
  Filled 2014-02-27: qty 6.7

## 2014-02-27 NOTE — Discharge Instructions (Signed)

## 2014-02-27 NOTE — ED Provider Notes (Signed)
CSN: [id number]     Arrival date & time 02/27/14  1449 History  This chart was scribed for non-physician practitioner, Montine Circle, PA-C working with Varney Biles, MD by Frederich Balding, ED scribe. This patient was seen in room TR09C/TR09C and the patient's care was started at 4:37 PM.   Chief Complaint  Patient presents with  . Ankle Pain   The history is provided by the patient. No language interpreter was used.   HPI Comments: Heather Graham is a 38 y.o. female who presents to the Emergency Department complaining of worsening right ankle pain that started yesterday. Reports associated swelling. Bearing weight, flexing and extending worsens the pain. She has used an ankle wrap with no relief. Pt has fractured her right ankle in the past and has had intermittent pain since.   Past Medical History  Diagnosis Date  . Asthma    History reviewed. No pertinent past surgical history. No family history on file. History  Substance Use Topics  . Smoking status: Never Smoker   . Smokeless tobacco: Not on file  . Alcohol Use: Yes   OB History   Grav Para Term Preterm Abortions TAB SAB Ect Mult Living                 Review of Systems  Constitutional: Negative for fever.  HENT: Negative for congestion.   Eyes: Negative for redness.  Respiratory: Negative for shortness of breath.   Cardiovascular: Negative for chest pain.  Gastrointestinal: Negative for abdominal distention.  Musculoskeletal: Positive for arthralgias and joint swelling.  Skin: Negative for rash.  Neurological: Negative for speech difficulty.  Psychiatric/Behavioral: Negative for confusion.   Allergies  Penicillins  Home Medications   Prior to Admission medications   Medication Sig Start Date End Date Taking? Authorizing Provider  albuterol (PROVENTIL HFA;VENTOLIN HFA) 108 (90 BASE) MCG/ACT inhaler Inhale 2 puffs into the lungs every 4 (four) hours as needed for wheezing or shortness of breath. 02/19/13  Yes  Sharon [address], MD   BP 116/77  Pulse 82  Temp(Src) 98.2 F (36.8 C) (Oral)  Resp 16  SpO2 100%  LMP 02/26/2014  Physical Exam  Nursing note and vitals reviewed. Constitutional: She is oriented to person, place, and time. She appears well-developed and well-nourished. No distress.  HENT:  Head: Normocephalic and atraumatic.  Eyes: Conjunctivae and EOM are normal.  Cardiovascular: Normal rate, regular rhythm and intact distal pulses.   Brisk capillary refill.  Pulmonary/Chest: Effort normal and breath sounds normal. No stridor. No respiratory distress.  Abdominal: She exhibits no distension.  Musculoskeletal: She exhibits no edema.  Mild tenderness to palpation over the right calcaneofibular ligament.  RPM and strength 5/5. No bony abnormality or deformity.   Neurological: She is alert and oriented to person, place, and time. No cranial nerve deficit.  Pt able to ambulate with slight limp.  Skin: Skin is warm and dry.  Psychiatric: She has a normal mood and affect.    ED Course  Procedures (including critical care time)  DIAGNOSTIC STUDIES: Oxygen Saturation is 100% on RA, normal by my interpretation.    COORDINATION OF CARE: 4:43 PM-Discussed treatment plan which includes ankle brace with pt at bedside and pt agreed to plan. Will give pt orthopedic referral and advised her to follow up if symptoms do not start resolving in 2 weeks.  Labs Review Labs Reviewed - No data to display  Imaging Review Dg Ankle Complete Right  02/27/2014   CLINICAL DATA:  Right  ankle pain.  EXAM: RIGHT ANKLE - COMPLETE 3+ VIEW  COMPARISON:  March 21, 2009.  FINDINGS: There is no evidence of fracture, dislocation, or joint effusion. Joint spaces are intact. Continued deformity of the distal tibia is noted which is unchanged and consistent with old trauma. Soft tissues are unremarkable.  IMPRESSION: No acute abnormality seen in the right ankle.   Electronically Signed   By: Sabino Dick M.D.    On: 02/27/2014 16:03     EKG Interpretation None      MDM   Final diagnoses:  Ankle pain, right     I personally performed the services described in this documentation, which was scribed in my presence. The recorded information has been reviewed and is accurate.  Montine Circle, PA-C 02/27/14 2121

## 2014-02-27 NOTE — ED Notes (Signed)
Multiple calls for pt, found pager lying on chair.

## 2014-02-27 NOTE — ED Notes (Signed)
Attempted to call for triage x 3. No response.

## 2014-02-27 NOTE — ED Notes (Signed)
Called x 2 for patient.

## 2014-02-27 NOTE — ED Notes (Signed)
Pt states that she rolled her right ankle yesterday. Reports 8/10 pain, denies taking anything for pain. Pulses/sensation intact. No obvious injury/swelling noted. Pt ambulatory to triage.

## 2014-03-01 NOTE — ED Provider Notes (Signed)
Medical screening examination/treatment/procedure(s) were performed by non-physician practitioner and as supervising physician I was immediately available for consultation/collaboration.   EKG Interpretation None       Varney Biles, MD 03/01/14 1230

## 2014-04-13 ENCOUNTER — Emergency Department (HOSPITAL_COMMUNITY)
Admission: EM | Admit: 2014-04-13 | Discharge: 2014-04-13 | Disposition: A | Discharge status: Home / Self Care | Payer: No Typology Code available for payment source | Attending: Emergency Medicine | Admitting: Emergency Medicine

## 2014-04-13 ENCOUNTER — Encounter (HOSPITAL_COMMUNITY): Payer: Self-pay | Admitting: Emergency Medicine

## 2014-04-13 DIAGNOSIS — IMO0002 Reserved for concepts with insufficient information to code with codable children: Secondary | ICD-10-CM | POA: Insufficient documentation

## 2014-04-13 DIAGNOSIS — J45901 Unspecified asthma with (acute) exacerbation: Secondary | ICD-10-CM | POA: Insufficient documentation

## 2014-04-13 DIAGNOSIS — S46909A Unspecified injury of unspecified muscle, fascia and tendon at shoulder and upper arm level, unspecified arm, initial encounter: Secondary | ICD-10-CM | POA: Insufficient documentation

## 2014-04-13 DIAGNOSIS — F411 Generalized anxiety disorder: Secondary | ICD-10-CM | POA: Insufficient documentation

## 2014-04-13 DIAGNOSIS — Z79899 Other long term (current) drug therapy: Secondary | ICD-10-CM | POA: Insufficient documentation

## 2014-04-13 DIAGNOSIS — S4980XA Other specified injuries of shoulder and upper arm, unspecified arm, initial encounter: Secondary | ICD-10-CM | POA: Insufficient documentation

## 2014-04-13 DIAGNOSIS — Z880 Allergy status to penicillin: Secondary | ICD-10-CM | POA: Insufficient documentation

## 2014-04-13 MED ORDER — ALBUTEROL SULFATE HFA 108 (90 BASE) MCG/ACT IN AERS
2.0000 | INHALATION_SPRAY | Freq: Once | RESPIRATORY_TRACT | Status: AC
  Administered 2014-04-13: 2 via RESPIRATORY_TRACT
  Filled 2014-04-13: qty 6.7
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
## 2014-04-13 NOTE — Discharge Instructions (Signed)
Read the information below.  You may return to the Emergency Department at any time for worsening condition or any new symptoms that concern you.  Please take ibuprofen or tylenol as needed for pain.  Use cold/ice packs on your upper arm for soreness.  Read the information below.  You may return to the Emergency Department at any time for worsening condition or any new symptoms that concern you.   Assault, General Assault includes any behavior, whether intentional or reckless, which results in bodily injury to another person and/or damage to property. Included in this would be any behavior, intentional or reckless, that by its nature would be understood (interpreted) by a reasonable person as intent to harm another person or to damage his/her property. Threats may be oral or written. They may be communicated through regular mail, computer, fax, or phone. These threats may be direct or implied. FORMS OF ASSAULT INCLUDE:  Physically assaulting a person. This includes physical threats to inflict physical harm as well as:  Slapping.  Hitting.  Poking.  Kicking.  Punching.  Pushing.  Arson.  Sabotage.  Equipment vandalism.  Damaging or destroying property.  Throwing or hitting objects.  Displaying a weapon or an object that appears to be a weapon in a threatening manner.  Carrying a firearm of any kind.  Using a weapon to harm someone.  Using greater physical size/strength to intimidate another.  Making intimidating or threatening gestures.  Bullying.  Hazing.  Intimidating, threatening, hostile, or abusive language directed toward another person.  It communicates the intention to engage in violence against that person. And it leads a reasonable person to expect that violent behavior may occur.  Stalking another person. IF IT HAPPENS AGAIN:  Immediately call for emergency help (911 in [place]  If someone poses clear and immediate danger to you, seek legal authorities  to have a protective or restraining order put in place.  Less threatening assaults can at least be reported to authorities. STEPS TO TAKE IF A SEXUAL ASSAULT HAS HAPPENED  Go to an area of safety. This may include a shelter or staying with a friend. Stay away from the area where you have been attacked. A large percentage of sexual assaults are caused by a friend, relative or associate.  If medications were given by your caregiver, take them as directed for the full length of time prescribed.  Only take over-the-counter or prescription medicines for pain, discomfort, or fever as directed by your caregiver.  If you have come in contact with a sexual disease, find out if you are to be tested again. If your caregiver is concerned about the HIV/AIDS virus, he/she may require you to have continued testing for several months.  For the protection of your privacy, test results can not be given over the phone. Make sure you receive the results of your test. If your test results are not back during your visit, make an appointment with your caregiver to find out the results. Do not assume everything is normal if you have not heard from your caregiver or the medical facility. It is important for you to follow up on all of your test results.  File appropriate papers with authorities. This is important in all assaults, even if it has occurred in a family or by a friend. SEEK MEDICAL CARE IF:  You have new problems because of your injuries.  You have problems that may be because of the medicine you are taking, such as:  Rash.  Itching.  Swelling.  Trouble breathing.  You develop belly (abdominal) pain, feel sick to your stomach (nausea) or are vomiting.  You begin to run a temperature.  You need supportive care or referral to a rape crisis center. These are centers with trained personnel who can help you get through this ordeal. SEEK IMMEDIATE MEDICAL CARE IF:  You are afraid of being  threatened, beaten, or abused. In [place] call 911.  You receive new injuries related to abuse.  You develop severe pain in any area injured in the assault or have any change in your condition that concerns you.  You faint or lose consciousness.  You develop chest pain or shortness of breath. Document Released: 08/04/2005 Document Revised: 10/27/2011 Document Reviewed: 03/22/2008 [facility] Patient Information 2015 [place], [place]. This information is not intended to replace advice given to you by your health care provider. Make sure you discuss any questions you have with your health care provider.
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
## 2014-04-14 NOTE — ED Provider Notes (Signed)
Medical screening examination/treatment/procedure(s) were performed by non-physician practitioner and as supervising physician I was immediately available for consultation/collaboration.   EKG Interpretation None        Orpah Greek, MD 04/14/14 0030

## 2014-04-16 ENCOUNTER — Emergency Department (HOSPITAL_COMMUNITY)
Admission: EM | Admit: 2014-04-16 | Discharge: 2014-04-16 | Disposition: A | Discharge status: Home / Self Care | Payer: No Typology Code available for payment source | Attending: Emergency Medicine | Admitting: Emergency Medicine

## 2014-04-16 ENCOUNTER — Encounter (HOSPITAL_COMMUNITY): Payer: Self-pay | Admitting: Emergency Medicine

## 2014-04-16 DIAGNOSIS — M79609 Pain in unspecified limb: Secondary | ICD-10-CM | POA: Insufficient documentation

## 2014-04-16 DIAGNOSIS — Z880 Allergy status to penicillin: Secondary | ICD-10-CM | POA: Insufficient documentation

## 2014-04-16 DIAGNOSIS — G5602 Carpal tunnel syndrome, left upper limb: Secondary | ICD-10-CM

## 2014-04-16 DIAGNOSIS — G5711 Meralgia paresthetica, right lower limb: Secondary | ICD-10-CM

## 2014-04-16 DIAGNOSIS — F411 Generalized anxiety disorder: Secondary | ICD-10-CM | POA: Insufficient documentation

## 2014-04-16 DIAGNOSIS — G5600 Carpal tunnel syndrome, unspecified upper limb: Secondary | ICD-10-CM | POA: Insufficient documentation

## 2014-04-16 DIAGNOSIS — J45909 Unspecified asthma, uncomplicated: Secondary | ICD-10-CM | POA: Insufficient documentation

## 2014-04-16 DIAGNOSIS — R002 Palpitations: Secondary | ICD-10-CM

## 2014-04-16 DIAGNOSIS — G5710 Meralgia paresthetica, unspecified lower limb: Secondary | ICD-10-CM | POA: Insufficient documentation

## 2014-04-16 DIAGNOSIS — E669 Obesity, unspecified: Secondary | ICD-10-CM | POA: Insufficient documentation

## 2014-04-16 LAB — CBG MONITORING, ED: GLUCOSE-CAPILLARY: 94 mg/dL (ref 70–99)

## 2014-04-16 MED ORDER — LORAZEPAM 1 MG PO TABS
0.5000 mg | ORAL_TABLET | Freq: Once | ORAL | Status: AC
  Administered 2014-04-16: 0.5 mg via ORAL
  Filled 2014-04-16: qty 1

## 2014-04-16 MED ORDER — LORAZEPAM 1 MG PO TABS: 1.0000 mg | ORAL_TABLET | Freq: Four times a day (QID) | ORAL | Status: DC | PRN

## 2014-04-16 NOTE — Progress Notes (Signed)
Orthopedic Tech Progress Note Patient Details:  Heather Graham 1976-01-01 [id number] Applied, tolerated well Ortho Devices Type of Ortho Device: Thumb velcro splint Ortho Device/Splint Location: LUE Ortho Device/Splint Interventions: Application   Asia R Thompson 04/16/2014, 7:16 AM

## 2014-04-16 NOTE — Discharge Instructions (Signed)
Pinched Nerve The term pinched nerve describes one type of damage or injury to a nerve or set of nerves. Pinched nerves can sometimes lead to other conditions. These include peripheral neuropathy, carpal tunnel syndrome, and tennis elbow. The extent of such injuries may vary from minor, temporary damage to a more permanent condition. Early diagnosis is important to prevent further damage or complications. Pinched nerve is a common cause of on-the-job injury. CAUSES  The injury may result from:  Compression.  Constriction.  Stretching. SYMPTOMS  Symptoms include:  Numbness.  "Pins and needles" or burning sensations.  Pain radiating outward from the injured area.  One of the most common examples of a single compressed nerve is the feeling of having a foot or hand "fall asleep." TREATMENT  The most often recommended treatment for pinched nerve is rest for the affected area. Corticosteroids help alleviate pain. In some cases, surgery is recommended. Physical therapy may be recommended. Splints or collars may be used. With treatment, most people recover from pinched nerve. In some cases, the damage is irreversible. Document Released: 07/25/2002 Document Revised: 10/27/2011 Document Reviewed: 07/12/2008 [facility] Patient Information 2015 [place], [place]. This information is not intended to replace advice given to you by your health care provider. Make sure you discuss any questions you have with your health care provider.   Carpal Tunnel Syndrome Carpal tunnel syndrome is a disorder of the nervous system in the wrist that causes pain, hand weakness, and/or loss of feeling. Carpal tunnel syndrome is caused by the compression, stretching, or irritation of the median nerve at the wrist joint. Athletes who experience carpal tunnel syndrome may notice a decrease in their performance to the condition, especially for sports that require strong hand or wrist action.  SYMPTOMS   Tingling, numbness, or  burning pain in the hand or fingers.  Inability to sleep due to pain in the hand.  Sharp pains that shoot from the wrist up the arm or to the fingers, especially at night.  Morning stiffness or cramping of the hand.  Thumb weakness, resulting in difficulty holding objects or making a fist.  Shiny, dry skin on the hand.  Reduced performance in any sport requiring a strong grip. CAUSES   Median nerve damage at the wrist is caused by pressure due to swelling, inflammation, or scarred tissue.  Sources of pressure include:  Repetitive gripping or squeezing that causes inflammation of the tendon sheaths.  Scarring or shortening of the ligament that covers the median nerve.  Traumatic injury to the wrist or forearm such as fracture, sprain, or dislocation.  Prolonged hyperextension (wrist bent backward) or hyperflexion (wrist bent downward) of the wrist. RISK INCREASES WITH:  Diabetes mellitus.  Menopause or amenorrhea.  Rheumatoid arthritis.  Raynaud disease.  Pregnancy.  Gout.  Kidney disease.  Ganglion cyst.  Repetitive hand or wrist action.  Hypothyroidism (underactive thyroid gland).  Repetitive jolting or shaking of the hands or wrist.  Prolonged forceful weight-bearing on the hands. PREVENTION  Bracing the hand and wrist straight during activities that involve repetitive grasping.  For activities that require prolonged extension of the wrist (bending towards the top of the forearm) periodically change the position of your wrists.  Learn and use proper technique in activities that result in the wrist position in neutral to slight extension.  Avoid bending the wrist into full extension or flexion (up or down).  Keep the wrist in a straight (neutral) position. To keep the wrist in this position, wear a splint.  Avoid  repetitive hand and wrist motions.  When possible avoid prolonged grasping of items (steering wheel of a car, a pen, a vacuum cleaner, or a  rake).  Loosen your grip for activities that require prolonged grasping of items.  Place keyboards and writing surfaces at the correct height as to decrease strain on the wrist and hand.  Alternate work tasks to avoid prolonged wrist flexion.  Avoid pinching activities (needlework and writing) as they may irritate your carpal tunnel syndrome.  If these activities are necessary, complete them for shorter periods of time.  When writing, use a felt tip or rollerball pen and/or build up the grip on a pen to decrease the forces required for writing. PROGNOSIS  Carpal tunnel syndrome is usually curable with appropriate conservative treatment and sometimes resolves spontaneously. For some cases, surgery is necessary, especially if muscle wasting or nerve changes have developed.  RELATED COMPLICATIONS   Permanent numbness and a weak thumb or fingers in the affected hand.  Permanent paralysis of a portion of the hand and fingers. TREATMENT  Treatment initially consists of stopping activities that aggravate the symptoms as well as medication and ice to reduce inflammation. A wrist splint is often recommended for wear during activities of repetitive motion as well as at night. It is also important to learn and use proper technique when performing activities that typically cause pain. On occasion, a corticosteroid injection may be given. If symptoms persist despite conservative treatment, surgery may be an option. Surgical techniques free the pinched or compressed nerve. Carpal tunnel surgery is usually performed on an outpatient basis, meaning you go home the same day as surgery. These procedures provide almost complete relief of all symptoms in 95% of patients. Expect at least 2 weeks for healing after surgery. For cases that are the result of repeated jolting or shaking of the hand or wrist or prolonged hyperextension, surgery is not usually recommended because stretching of the median nerve, not  compression, is usually the cause of carpal tunnel syndrome in these cases. MEDICATION   If pain medication is necessary, nonsteroidal anti-inflammatory medications, such as aspirin and ibuprofen, or other minor pain relievers, such as acetaminophen, are often recommended.  Do not take pain medication for 7 days before surgery.  Prescription pain relievers are usually only prescribed after surgery. Use only as directed and only as much as you need.  Corticosteroid injections may be given to reduce inflammation. However, they are not always recommended.  Vitamin B6 (pyridoxine) may reduce symptoms; use only if prescribed for your disorder. SEEK MEDICAL CARE IF:   Symptoms get worse or do not improve in 2 weeks despite treatment.  You also have a current or recent history of neck or shoulder injury that has resulted in pain or tingling elsewhere in your arm. Document Released: 08/04/2005 Document Revised: 12/19/2013 Document Reviewed: 11/16/2008 [facility] Patient Information 2015 [place], [place]. This information is not intended to replace advice given to you by your health care provider. Make sure you discuss any questions you have with your health care provider.  Palpitations A palpitation is the feeling that your heartbeat is irregular. It may feel like your heart is fluttering or skipping a beat. It may also feel like your heart is beating faster than normal. This is usually not a serious problem. In some cases, you may need more medical tests. HOME CARE  Avoid:  Caffeine in coffee, tea, soft drinks, diet pills, and energy drinks.  Chocolate.  Alcohol.  Stop smoking if you smoke.  Reduce your stress and anxiety. Try:  A method that measures bodily functions so you can learn to control them (biofeedback).  Yoga.  Meditation.  Physical activity such as swimming, jogging, or walking.  Get plenty of rest and sleep. GET HELP IF:  Your fast or irregular heartbeat continues  after 24 hours.  Your palpitations occur more often. GET HELP RIGHT AWAY IF:   You have chest pain.  You feel short of breath.  You have a very bad headache.  You feel dizzy or pass out (faint). MAKE SURE YOU:   Understand these instructions.  Will watch your condition.  Will get help right away if you are not doing well or get worse. Document Released: 05/13/2008 Document Revised: 12/19/2013 Document Reviewed: 10/03/2011 [facility] Patient Information 2015 [place], [place]. This information is not intended to replace advice given to you by your health care provider. Make sure you discuss any questions you have with your health care provider.   Emergency Department Resource Guide 1) Find a Doctor and Pay Out of Pocket Although you won't have to find out who is covered by your insurance plan, it is a good idea to ask around and get recommendations. You will then need to call the office and see if the doctor you have chosen will accept you as a new patient and what types of options they offer for patients who are self-pay. Some doctors offer discounts or will set up payment plans for their patients who do not have insurance, but you will need to ask so you aren't surprised when you get to your appointment.  2) Contact Your Local Health Department Not all health departments have doctors that can see patients for sick visits, but many do, so it is worth a call to see if yours does. If you don't know where your local health department is, you can check in your phone book. The CDC also has a tool to help you locate your state's health department, and many state websites also have listings of all of their local health departments.  3) Find a [facility] If your illness is not likely to be very severe or complicated, you may want to try a walk in clinic. These are popping up all over the country in pharmacies, drugstores, and shopping centers. They're usually staffed by nurse practitioners or  physician assistants that have been trained to treat common illnesses and complaints. They're usually fairly quick and inexpensive. However, if you have serious medical issues or chronic medical problems, these are probably not your best option.  No Primary Care Doctor: - Call Health Connect at  [phone] - they can help you locate a primary care doctor that  accepts your insurance, provides certain services, etc. - Physician Referral Service- [phone]  Chronic Pain Problems: Organization         Address  Phone   Notes  [facility]  [phone] Patients need to be referred by their primary care doctor.   Medication Assistance: Organization         Address  Phone   Notes  [facility] Medication [facility] [address] [place], [place] [address] [phone] --Must be a resident of [facility] -- Must have NO insurance coverage whatsoever (no Medicaid/ Medicare, etc.) -- The pt. MUST have a primary care doctor that directs their care regularly and follows them in the community   MedAssist  [phone]   [address]  [phone]  Agencies that provide inexpensive medical care: Organization         Address  Phone   Notes  [place]  [phone]   [facility]    [phone]   [facility] [place], [place] [address] [phone]   [place] [address] [place] [phone]   Planned Parenthood    [phone]   [facility]    [phone]   [place] and [address], [place] Phone:  [phone], Fax:  [phone] Hours of Operation:  9 am - 6 pm, M-F.  Also accepts Medicaid/Medicare and self-pay.  [facility] for [place] [address], [address], [place] Phone: [phone], Fax: [phone]. Hours of Operation:  8:30 am - 5:30 pm, M-F.   Also accepts Medicaid and self-pay.  [facility] [address], [place] Phone: [phone]   [place], [place], [place] [phone], Ext. [phone] Mondays & Thursdays: 7-9 AM.  First 15 patients are seen on a first come, first serve basis.    [place] Providers:  Organization         Address  Phone   Notes  [facility] [address], [place] [phone] Also accepts self-pay patients.  [facility] [address], [place]  [phone]   [place], [address], [place] [phone]   [facility] [address], [place] [phone]   Lucianne Lei [address], [place]   [phone] Only accepts [place] Access [place] patients after they have their name applied to their card.   Self-Pay (no insurance) in [place]:  Organization         Address  Phone   Notes  Sickle Cell Patients, [facility] Internal Medicine [place] [phone]   [facility] [place] [phone]   [facility] [place]  [place], [place], [place] [phone]   [facility]/Dr. Osei-Bonsu  [address] [place] or [address], [place] [phone] Phone number for both [place] and [place] locations is the same.  [facility] [address], [place] [phone]   [facility] [place] [address] [place] or [address] [phone]   [facility] [address] [place] [phone], phone; [phone], fax Sees patients 1st and 3rd Saturday of every month.  Must not qualify for public or private insurance (i.e. Medicaid, Medicare, [place] Health Choice, Veterans'  Benefits)  Household income should be no more than 200% of the poverty level The clinic cannot treat you if you are pregnant or think you are pregnant  Sexually transmitted diseases are not treated at the clinic.    Dental Care: Organization         Address  Phone  Notes  [facility] Department of [facility] [place] [phone] Accepts children up to age 60 who are enrolled in [place] or [place]; pregnant women with a Medicaid card; and children who have applied for Medicaid or  [place] Health Choice, but were declined, whose parents can pay a reduced fee at time of service.  [facility] Department of [facility]  [address], [place] [phone] Accepts children up to age 51 who are enrolled in [place] or [place]; pregnant women with a Medicaid card; and children who have applied for Medicaid or [place] Health Choice, but were declined, whose parents can pay a reduced fee at time of service.  [place] Adult Dental Access PROGRAM  [place] [phone] Patients are seen by appointment only. Walk-ins are not accepted. [place] will see patients 26 years of age and older. Monday - Tuesday (8am-5pm) Most Wednesdays (8:30-5pm) $30 per visit, cash only  [facility] Adult Dental Access PROGRAM  [address], [facility] [phone] Patients are seen by appointment only. Walk-ins are not accepted. [place] will see patients 73 years of age and older. One Wednesday Evening (Monthly: Volunteer Based).  $30 per visit, cash only  [place]  [phone] for adults; Children under age 73, call Graduate Pediatric Dentistry at [phone]. Children aged 68-14, please call [phone] to request a pediatric application.  Dental services are provided in all areas of dental care including fillings, crowns and bridges, complete and partial  dentures, implants, gum treatment, root canals, and extractions. Preventive care is also provided. Treatment is provided to both adults and children. Patients are selected via a lottery and there is often a waiting list.   [facility] [address] [place]  [phone] [email or web address]   [facility] [address] [place], [place] [phone], Ext. [phone] Second and Fourth Thursday of each month, opens at 6:30 AM; Clinic ends at 9 AM.  Patients are seen on a first-come first-served basis, and a limited number are seen during each clinic.   [facility]  [address] Hillard Danker [place], [place] [phone]   Eligibility Requirements You must have lived in [place], [place], or [place] counties for at least the last three months.   You cannot be eligible for state or federal sponsored [employer], including [employer], [place], or [employer].   You generally cannot be eligible for healthcare insurance through your employer.    How to apply: Eligibility screenings are held every Tuesday and Wednesday afternoon from 1:00 pm until 4:00 pm. You do not need an appointment for the interview!  [facility] [address] [place], [place]   [place]  [place] Department  [place]  [phone]    Behavioral Health Resources in the Community: Intensive Outpatient Programs Organization         Address  Phone  Notes  [place]. [address] [place], [place] [phone]   [facility] [address] [place], [place]   ADS: Alcohol & Drug Svcs [address], [place], [place]   [place] [address]  [place], [place] or [phone]   Substance Abuse Resources Organization          Address  Phone  Notes  Al[facility] Drug Services  [phone]   [facility]  [phone]   [facility] [place]  [phone]   Chinita Pester  [phone]   Residential & Outpatient Substance Abuse Program  [phone]  Psychological Services Organization         Address  Phone  Notes  [facility] [place]  [place]  [phone]   [place] [address], [place] or [phone]    Mobile Crisis Teams Organization         Address  Phone  Notes  [facility], Mobile Crisis Care Unit  [phone]   [facility]  [address] [place], [place]   [place] Levels [address] [place] [phone]    Self-Help/Support Groups Organization         Address  Phone             Notes  [place]. of [place] - variety of support groups  [place] Call for more information  Narcotics Anonymous (NA), [facility] [address], [employer]   2 meetings at this location   Special educational needs teacher         Address  Phone  Notes  [facility] Residential Treatment [place],    [place]  [phone]   [facility]  [address], [place] [phone], [place], [place]   [place], [place] [phone] Admissions: 8am-3pm M-F  Incentives Substance [place] [address]    [place], [place] [phone]   [facility] [address] [place], [address] [place], [place]   [facility] [address]  [place], [place]   [facility] Programs - Intensive Outpatient [address]een [address], [place], [place]   [facility] ([place].,  [place], [place] [phone] or [phone]   [facility] Services (RTS) [address], [place], [place] Accepts Medicaid  [facility] [place] [address],  [place] [phone] Substance Abuse/Addiction Treatment   [facility] Organization         Address  Phone  Notes  [facility]  [phone]   Domenic Schwab, PhD [address] [place]lis [place], [place]   [phone] or [phone]   Rufus Conyers [place], [place] [phone]   [facility] [address] [place], [place] [phone] Insurance/Medicaid/sponsorship through [facility] and Families [address], [address] [place]                                    [place], [place] [phone] [place] [address], [place] [phone]    Dr. Adele Schilder  [phone]   [facility] [place] Dept. 1) [address], [place] 2) [place] 3)  [place] [address], [place] [phone]  [phone]   [place] [phone] or [phone] (After Hours)

## 2014-04-16 NOTE — ED Notes (Signed)
Pt c/o of thigh numbness that has been ongoing for about 3 months ago.

## 2014-04-16 NOTE — ED Notes (Signed)
CBG 94 
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
## 2014-04-17 ENCOUNTER — Encounter (HOSPITAL_COMMUNITY): Payer: Self-pay | Admitting: Emergency Medicine

## 2014-04-17 ENCOUNTER — Emergency Department (HOSPITAL_COMMUNITY)
Admission: EM | Admit: 2014-04-17 | Discharge: 2014-04-17 | Disposition: A | Discharge status: Home / Self Care | Payer: No Typology Code available for payment source | Attending: Emergency Medicine | Admitting: Emergency Medicine

## 2014-04-17 DIAGNOSIS — F419 Anxiety disorder, unspecified: Secondary | ICD-10-CM

## 2014-04-17 MED ORDER — ACETAMINOPHEN 500 MG PO TABS
1000.0000 mg | ORAL_TABLET | Freq: Once | ORAL | Status: AC
  Administered 2014-04-17: 1000 mg via ORAL
  Filled 2014-04-17: qty 2

## 2014-04-17 NOTE — ED Notes (Signed)
Patient here with complaint of right foot and leg pain. Was here this morning with the same problem. States that the medicine we gave while she was here helped a lot. She was given ativan and was prescribed the same. When asked why she hasn't taken her medication patient states that she was unable to get the Rx filled do to medical benefit reasons.

## 2014-04-17 NOTE — ED Provider Notes (Signed)
  Medical screening examination/treatment/procedure(s) were performed by non-physician practitioner and as supervising physician I was immediately available for consultation/collaboration.   EKG Interpretation   Date/Time:  Sunday April 16 2014 07:04:07 EDT Ventricular Rate:  83 PR Interval:  136 QRS Duration: 107 QT Interval:  396 QTC Calculation: 465 R Axis:   57 Text Interpretation:  Sinus rhythm Normal intervals Confirmed by Kathrynn Humble,  MD, Thelma Comp [phone]) on 04/16/2014 7:10:17 AM         Carmin Muskrat, MD 04/17/14 [phone]

## 2014-04-17 NOTE — ED Provider Notes (Signed)
Medical screening examination/treatment/procedure(s) were performed by non-physician practitioner and as supervising physician I was immediately available for consultation/collaboration.   EKG Interpretation None        Merryl Hacker, MD 04/17/14 [phone]

## 2014-04-17 NOTE — ED Notes (Signed)
Pt wanting to leave at this time after seeing EDP, EDP notified and is all right with pt discharge

## 2014-04-17 NOTE — ED Provider Notes (Signed)
CSN: [id number]     Arrival date & time 04/16/14  2344 History   First MD Initiated Contact with Patient 04/17/14 0015     Chief Complaint  Patient presents with  . Foot Pain  . Leg Pain     (Consider location/radiation/quality/duration/timing/severity/associated sxs/prior Treatment) HPI Comments: Patient presents emergency department with chief complaint of constant, mild, right foot and leg pain. She states that she is here this morning for the same problem. States that she was given some medication, which helped a lot, but she has been unable to get her prescription filled. She states that she has not been able to get it filled because of medical benefit reasons. She states that she still has the same pain which she has had for the past 3 months. There are no new or changing symptoms. She is able to ambulate. There no aggravating or relieving factors.  The history is provided by the patient. No language interpreter was used.    Past Medical History  Diagnosis Date  . Asthma    History reviewed. No pertinent past surgical history. No family history on file. History  Substance Use Topics  . Smoking status: Never Smoker   . Smokeless tobacco: Never Used  . Alcohol Use: Yes   OB History   Grav Para Term Preterm Abortions TAB SAB Ect Mult Living                 Review of Systems  Constitutional: Negative for fever and chills.  Respiratory: Negative for shortness of breath.   Cardiovascular: Negative for chest pain.  Gastrointestinal: Negative for nausea, vomiting, diarrhea and constipation.  Genitourinary: Negative for dysuria.  Musculoskeletal: Positive for arthralgias.      Allergies  Penicillins  Home Medications   Prior to Admission medications   Medication Sig Start Date End Date Taking? Authorizing Provider  albuterol (PROVENTIL HFA;VENTOLIN HFA) 108 (90 BASE) MCG/ACT inhaler Inhale 2 puffs into the lungs every 4 (four) hours as needed for wheezing or shortness  of breath. 02/19/13   [place], MD  LORazepam (ATIVAN) 1 MG tablet Take 1 tablet (1 mg total) by mouth every 6 (six) hours as needed for anxiety. 04/16/14   Elwyn Lade, PA-C   BP 126/65  Pulse 95  Temp(Src) 98.6 F (37 C) (Oral)  Resp 16  SpO2 100%  LMP 04/13/2014 Physical Exam  Nursing note and vitals reviewed. Constitutional: She is oriented to person, place, and time. She appears well-developed and well-nourished.  HENT:  Head: Normocephalic and atraumatic.  Eyes: Conjunctivae and EOM are normal. Pupils are equal, round, and reactive to light.  Neck: Normal range of motion. Neck supple.  Cardiovascular: Normal rate and regular rhythm.  Exam reveals no gallop and no friction rub.   No murmur heard. Pulmonary/Chest: Effort normal and breath sounds normal. No respiratory distress. She has no wheezes. She has no rales. She exhibits no tenderness.  Clear to auscultation bilaterally  Abdominal: Soft. She exhibits no distension and no mass. There is no tenderness. There is no rebound and no guarding.  No focal abdominal tenderness, no RLQ tenderness or pain at McBurney's point, no RUQ tenderness or Murphy's sign, no left-sided abdominal tenderness, no fluid wave, or signs of peritonitis   Musculoskeletal: Normal range of motion. She exhibits no edema and no tenderness.  Moves all extremities, range of motion and strength 5/5 through all extremities, no bony tenderness, abnormality or deformity  Neurological: She is alert and oriented to person,  place, and time.  Sensation and strength intact  Skin: Skin is warm and dry.  Psychiatric: She has a normal mood and affect. Her behavior is normal. Judgment and thought content normal.    ED Course  Procedures (including critical care time) Labs Review Labs Reviewed - No data to display  Imaging Review No results found.   EKG Interpretation None      MDM   Final diagnoses:  Anxiety    Patient seen here earlier  today for the same complaint. States she was unable to get her prescription filled. Also states that she has been unable to get followup. I told her that she'll have to wait until tomorrow when her doctor's office opens. She is anxious, but not in any apparent distress.  She asks for a tylenol and a gingerale.  Ambulates appropriately.  No new acute problems.  Stable and ready for discharge.   12:33 AM Patient eloped prior to treatment and discharge.   Montine Circle, PA-C 04/17/14 [phone]

## 2014-04-18 DIAGNOSIS — Z880 Allergy status to penicillin: Secondary | ICD-10-CM | POA: Insufficient documentation

## 2014-04-18 DIAGNOSIS — J45909 Unspecified asthma, uncomplicated: Secondary | ICD-10-CM | POA: Insufficient documentation

## 2014-04-18 DIAGNOSIS — Z79899 Other long term (current) drug therapy: Secondary | ICD-10-CM | POA: Insufficient documentation

## 2014-04-18 DIAGNOSIS — R209 Unspecified disturbances of skin sensation: Secondary | ICD-10-CM | POA: Insufficient documentation

## 2014-04-18 DIAGNOSIS — M79609 Pain in unspecified limb: Secondary | ICD-10-CM | POA: Insufficient documentation

## 2014-04-19 ENCOUNTER — Emergency Department (HOSPITAL_COMMUNITY)
Admission: EM | Admit: 2014-04-19 | Discharge: 2014-04-19 | Disposition: A | Discharge status: Home / Self Care | Payer: No Typology Code available for payment source | Attending: Emergency Medicine | Admitting: Emergency Medicine

## 2014-04-19 ENCOUNTER — Inpatient Hospital Stay (HOSPITAL_COMMUNITY)
Admission: AD | Admit: 2014-04-19 | Discharge: 2014-04-20 | Disposition: A | Discharge status: Home / Self Care | Payer: Self-pay | Source: Ambulatory Visit | Attending: Obstetrics & Gynecology | Admitting: Obstetrics & Gynecology

## 2014-04-19 ENCOUNTER — Encounter (HOSPITAL_COMMUNITY): Payer: Self-pay | Admitting: Emergency Medicine

## 2014-04-19 DIAGNOSIS — N920 Excessive and frequent menstruation with regular cycle: Secondary | ICD-10-CM | POA: Insufficient documentation

## 2014-04-19 DIAGNOSIS — R202 Paresthesia of skin: Secondary | ICD-10-CM

## 2014-04-19 DIAGNOSIS — N939 Abnormal uterine and vaginal bleeding, unspecified: Secondary | ICD-10-CM

## 2014-04-19 DIAGNOSIS — N946 Dysmenorrhea, unspecified: Secondary | ICD-10-CM | POA: Insufficient documentation

## 2014-04-19 NOTE — ED Provider Notes (Signed)
Medical screening examination/treatment/procedure(s) were performed by non-physician practitioner and as supervising physician I was immediately available for consultation/collaboration.   EKG Interpretation None        Everlene Balls, MD 04/19/14 1601

## 2014-04-19 NOTE — ED Notes (Signed)
Pt states that she has right sided thigh pain for "several months."

## 2014-04-19 NOTE — ED Provider Notes (Signed)
CSN: [id number]     Arrival date & time 04/18/14  2353 History   First MD Initiated Contact with Patient 04/19/14 0114     Chief Complaint  Patient presents with  . Extremity Pain     (Consider location/radiation/quality/duration/timing/severity/associated sxs/prior Treatment) HPI Comments: Patient has an area of paresthesia on her anterior right thigh.  That has been present for several months.  It started intermittently, but has been persistent for the past, month.  She has been seen and evaluated for this in the past.  She's been referred to orthopedics and given a prescription for Ativan.  She states she doesn't like the way.  The Ativan makes her feel and it doesn't not alleviate her symptoms.  She cannot afford to go to a specialist.  Due to lack of insurance.  She presents to the emergency department tonight, because its "annoying"  Patient is a 38 y.o. female presenting with extremity pain. The history is provided by the patient.  Extremity Pain This is a recurrent problem. The current episode started more than 1 month ago. The problem occurs constantly. The problem has been unchanged. Associated symptoms include numbness. Pertinent negatives include no chills, fever, headaches, neck pain or weakness. Nothing aggravates the symptoms. She has tried nothing for the symptoms. The treatment provided no relief.    Past Medical History  Diagnosis Date  . Asthma    History reviewed. No pertinent past surgical history. No family history on file. History  Substance Use Topics  . Smoking status: Never Smoker   . Smokeless tobacco: Never Used  . Alcohol Use: Yes   OB History   Grav Para Term Preterm Abortions TAB SAB Ect Mult Living                 Review of Systems  Constitutional: Negative for fever and chills.  Musculoskeletal: Negative for back pain and neck pain.  Neurological: Positive for numbness. Negative for dizziness, weakness and headaches.  All other systems reviewed  and are negative.     Allergies  Penicillins  Home Medications   Prior to Admission medications   Medication Sig Start Date End Date Taking? Authorizing Provider  albuterol (PROVENTIL HFA;VENTOLIN HFA) 108 (90 BASE) MCG/ACT inhaler Inhale 2 puffs into the lungs every 4 (four) hours as needed for wheezing or shortness of breath. 02/19/13  Yes Sharon Mt Street, MD  LORazepam (ATIVAN) 1 MG tablet Take 1 tablet (1 mg total) by mouth every 6 (six) hours as needed for anxiety. 04/16/14   Elwyn Lade, PA-C   BP 125/63  Pulse 93  Temp(Src) 99.2 F (37.3 C) (Oral)  Resp 18  Ht 5\' 6"  (1.676 m)  Wt 295 lb (133.811 kg)  BMI 47.64 kg/m2  SpO2 98%  LMP 04/13/2014 Physical Exam  Nursing note and vitals reviewed. Constitutional: She appears well-developed and well-nourished.  HENT:  Head: Normocephalic.  Eyes: Pupils are equal, round, and reactive to light.  Neck: Normal range of motion.  Cardiovascular: Normal rate.   Pulmonary/Chest: Effort normal.  Musculoskeletal: Normal range of motion. She exhibits no edema and no tenderness.       Legs: Neurological: She is alert.  Skin: Skin is warm.    ED Course  Procedures (including critical care time) Labs Review Labs Reviewed - No data to display  Imaging Review No results found.   EKG Interpretation None      MDM   Final diagnoses:  Paresthesia of right leg  Garald Balding, NP 04/19/14 [phone]

## 2014-04-20 ENCOUNTER — Encounter (HOSPITAL_COMMUNITY): Payer: Self-pay | Admitting: *Deleted

## 2014-04-20 DIAGNOSIS — N939 Abnormal uterine and vaginal bleeding, unspecified: Secondary | ICD-10-CM

## 2014-04-20 DIAGNOSIS — N926 Irregular menstruation, unspecified: Secondary | ICD-10-CM

## 2014-04-20 LAB — CBC WITH DIFFERENTIAL/PLATELET
BASOS PCT: 1 % (ref 0–1)
Basophils Absolute: 0 10*3/uL (ref 0.0–0.1)
Eosinophils Absolute: 0.2 10*3/uL (ref 0.0–0.7)
Eosinophils Relative: 3 % (ref 0–5)
HCT: 23.7 % — ABNORMAL LOW (ref 36.0–46.0)
Hemoglobin: 7 g/dL — ABNORMAL LOW (ref 12.0–15.0)
LYMPHS ABS: 1.6 10*3/uL (ref 0.7–4.0)
Lymphocytes Relative: 29 % (ref 12–46)
MCH: 21.2 pg — ABNORMAL LOW (ref 26.0–34.0)
MCHC: 29.5 g/dL — ABNORMAL LOW (ref 30.0–36.0)
MCV: 71.8 fL — ABNORMAL LOW (ref 78.0–100.0)
Monocytes Absolute: 0.4 10*3/uL (ref 0.1–1.0)
Monocytes Relative: 7 % (ref 3–12)
NEUTROS ABS: 3.4 10*3/uL (ref 1.7–7.7)
NEUTROS PCT: 60 % (ref 43–77)
Platelets: 477 10*3/uL — ABNORMAL HIGH (ref 150–400)
RBC: 3.3 MIL/uL — AB (ref 3.87–5.11)
RDW: 16.7 % — ABNORMAL HIGH (ref 11.5–15.5)
WBC: 5.6 10*3/uL (ref 4.0–10.5)

## 2014-04-20 LAB — HIV ANTIBODY (ROUTINE TESTING W REFLEX): HIV 1&2 Ab, 4th Generation: NONREACTIVE

## 2014-04-20 LAB — URINALYSIS, ROUTINE W REFLEX MICROSCOPIC
Glucose, UA: NEGATIVE mg/dL
Hgb urine dipstick: NEGATIVE
Ketones, ur: NEGATIVE mg/dL
Leukocytes, UA: NEGATIVE
NITRITE: NEGATIVE
Protein, ur: 30 mg/dL — AB
Urobilinogen, UA: 0.2 mg/dL (ref 0.0–1.0)
pH: 5.5 (ref 5.0–8.0)

## 2014-04-20 LAB — URINE MICROSCOPIC-ADD ON

## 2014-04-20 LAB — WET PREP, GENITAL
TRICH WET PREP: NONE SEEN
Yeast Wet Prep HPF POC: NONE SEEN

## 2014-04-20 LAB — GC/CHLAMYDIA PROBE AMP
CT Probe RNA: NEGATIVE
GC Probe RNA: NEGATIVE

## 2014-04-20 LAB — POCT PREGNANCY, URINE: Preg Test, Ur: NEGATIVE

## 2014-04-20 MED ORDER — MEGESTROL ACETATE 40 MG PO TABS: ORAL_TABLET | ORAL | Status: DC

## 2014-04-20 MED ORDER — FERROUS SULFATE 325 (65 FE) MG PO TABS: 325.0000 mg | ORAL_TABLET | Freq: Every day | ORAL | Status: DC

## 2014-04-20 MED ORDER — IBUPROFEN 600 MG PO TABS
600.0000 mg | ORAL_TABLET | Freq: Once | ORAL | Status: AC
  Administered 2014-04-20: 600 mg via ORAL
  Filled 2014-04-20: qty 1

## 2014-04-20 NOTE — MAU Note (Signed)
Pt states she is having heavy bleeding with odor and pt states that she is" on my period." Pt states she has had to use 2 depends for the past 13 days, but not  Today pt states she is using regular pad. Pt states she changes her pad when she goes to the bathroom. Pt states she has not had a "pap smear in 5 years" pt informed that we do not do pap smears here but we will evaluated her vaginal bleeding.

## 2014-04-20 NOTE — Progress Notes (Signed)
No vaginal bleeding noted

## 2014-04-20 NOTE — Discharge Instructions (Signed)
Abnormal Uterine Bleeding Abnormal uterine bleeding can affect women at various stages in life, including teenagers, women in their reproductive years, pregnant women, and women who have reached menopause. Several kinds of uterine bleeding are considered abnormal, including:  Bleeding or spotting between periods.   Bleeding after sexual intercourse.   Bleeding that is heavier or more than normal.   Periods that last longer than usual.  Bleeding after menopause.  Many cases of abnormal uterine bleeding are minor and simple to treat, while others are more serious. Any type of abnormal bleeding should be evaluated by your health care provider. Treatment will depend on the cause of the bleeding. HOME CARE INSTRUCTIONS Monitor your condition for any changes. The following actions may help to alleviate any discomfort you are experiencing:  Avoid the use of tampons and douches as directed by your health care provider.  Change your pads frequently. You should get regular pelvic exams and Pap tests. Keep all follow-up appointments for diagnostic tests as directed by your health care provider.  SEEK MEDICAL CARE IF:   Your bleeding lasts more than 1 week.   You feel dizzy at times.  SEEK IMMEDIATE MEDICAL CARE IF:   You pass out.   You are changing pads every 15 to 30 minutes.   You have abdominal pain.  You have a fever.   You become sweaty or weak.   You are passing large blood clots from the vagina.   You start to feel nauseous and vomit. MAKE SURE YOU:   Understand these instructions.  Will watch your condition.  Will get help right away if you are not doing well or get worse. Document Released: 08/04/2005 Document Revised: 08/09/2013 Document Reviewed: 03/03/2013 Exit[employer] Patient Information 2015 [employer]. This information is not intended to replace advice given to you by your health care provider. Make sure you discuss any questions you have with your  health care provider.  

## 2014-04-20 NOTE — MAU Note (Signed)
Pt states she has had bilateral tubal ligation

## 2014-04-20 NOTE — MAU Provider Note (Signed)
History     CSN: [id number]  Arrival date and time: 04/19/14 2356   First Provider Initiated Contact with Patient 04/20/14 [phone]      Chief Complaint  Patient presents with  . Menorrhagia   HPI [name] is a 38 y.o. G2P2 who presents to MAU today with complaint of heavy vaginal bleeding with clots x 2 weeks. She states that she has had to wear to depends pads 2 at a time. She states today bleeding is better and only requiring normal pads. She also states cramping in her pelvic area. She states a history of dysmenorrhea. She denies a history of abnormal bleeding. She states that she has felt weak, faint and dizzy.   OB History   Grav Para Term Preterm Abortions TAB SAB Ect Mult Living   2 2        2       Past Medical History  Diagnosis Date  . Asthma     Past Surgical History  Procedure Laterality Date  . Tubal ligation      History reviewed. No pertinent family history.  History  Substance Use Topics  . Smoking status: Never Smoker   . Smokeless tobacco: Never Used  . Alcohol Use: Yes    Allergies:  Allergies  Allergen Reactions  . Penicillins Hives    Prescriptions prior to admission  Medication Sig Dispense Refill  . albuterol (PROVENTIL HFA;VENTOLIN HFA) 108 (90 BASE) MCG/ACT inhaler Inhale 2 puffs into the lungs every 4 (four) hours as needed for wheezing or shortness of breath.  1 Inhaler  1  . LORazepam (ATIVAN) 1 MG tablet Take 1 tablet (1 mg total) by mouth every 6 (six) hours as needed for anxiety.  10 tablet  0    Review of Systems  Constitutional: Negative for fever and malaise/fatigue.  Gastrointestinal: Positive for abdominal pain. Negative for nausea and vomiting.  Genitourinary:       + vaginal bleeding  Neurological: Positive for dizziness and weakness. Negative for loss of consciousness.   Physical Exam   Blood pressure 125/67, pulse 96, temperature 99.6 F (37.6 C), temperature source Oral, resp. rate 20, last menstrual  period 04/13/2014.  Physical Exam  Constitutional: She is oriented to person, place, and time. She appears well-developed and well-nourished. No distress.  HENT:  Head: Normocephalic.  Cardiovascular: Normal rate.   Respiratory: Effort normal.  GI: Soft. Bowel sounds are normal. She exhibits no distension and no mass. There is no tenderness. There is no rebound and no guarding.  Genitourinary: Uterus is tender (mild). Uterus is not enlarged. Cervix exhibits no motion tenderness, no discharge and no friability. Right adnexum displays no mass and no tenderness. Left adnexum displays tenderness (mild). Left adnexum displays no mass. No bleeding around the vagina. Vaginal discharge (scant thin, white-yellow discharge noted) found.  Neurological: She is alert and oriented to person, place, and time.  Skin: Skin is warm and dry. No erythema.  Psychiatric: She has a normal mood and affect. Her speech is tangential.   Results for orders placed during the hospital encounter of 04/19/14 (from the past 24 hour(s))  URINALYSIS, ROUTINE W REFLEX MICROSCOPIC     Status: Abnormal   Collection Time    04/20/14 12:15 AM      Result Value Ref Range   Color, Urine YELLOW  YELLOW   APPearance CLEAR  CLEAR   Specific Gravity, Urine >1.030 (*) 1.005 - 1.030   pH 5.5  5.0 - 8.0  Glucose, UA NEGATIVE  NEGATIVE mg/dL   Hgb urine dipstick NEGATIVE  NEGATIVE   Bilirubin Urine SMALL (*) NEGATIVE   Ketones, ur NEGATIVE  NEGATIVE mg/dL   Protein, ur 30 (*) NEGATIVE mg/dL   Urobilinogen, UA 0.2  0.0 - 1.0 mg/dL   Nitrite NEGATIVE  NEGATIVE   Leukocytes, UA NEGATIVE  NEGATIVE  URINE MICROSCOPIC-ADD ON     Status: Abnormal   Collection Time    04/20/14 12:15 AM      Result Value Ref Range   Squamous Epithelial / LPF RARE  RARE   WBC, UA 0-2  <3 WBC/hpf   RBC / HPF 0-2  <3 RBC/hpf   Bacteria, UA FEW (*) RARE   Urine-Other MUCOUS PRESENT    POCT PREGNANCY, URINE     Status: None   Collection Time    04/20/14  12:51 AM      Result Value Ref Range   Preg Test, Ur NEGATIVE  NEGATIVE  CBC WITH DIFFERENTIAL     Status: Abnormal   Collection Time    04/20/14  1:03 AM      Result Value Ref Range   WBC 5.6  4.0 - 10.5 K/uL   RBC 3.30 (*) 3.87 - 5.11 MIL/uL   Hemoglobin 7.0 (*) 12.0 - 15.0 g/dL   HCT 23.7 (*) 36.0 - 46.0 %   MCV 71.8 (*) 78.0 - 100.0 fL   MCH 21.2 (*) 26.0 - 34.0 pg   MCHC 29.5 (*) 30.0 - 36.0 g/dL   RDW 16.7 (*) 11.5 - 15.5 %   Platelets 477 (*) 150 - 400 K/uL   Neutrophils Relative % 60  43 - 77 %   Neutro Abs 3.4  1.7 - 7.7 K/uL   Lymphocytes Relative 29  12 - 46 %   Lymphs Abs 1.6  0.7 - 4.0 K/uL   Monocytes Relative 7  3 - 12 %   Monocytes Absolute 0.4  0.1 - 1.0 K/uL   Eosinophils Relative 3  0 - 5 %   Eosinophils Absolute 0.2  0.0 - 0.7 K/uL   Basophils Relative 1  0 - 1 %   Basophils Absolute 0.0  0.0 - 0.1 K/uL  WET PREP, GENITAL     Status: Abnormal   Collection Time    04/20/14  1:05 AM      Result Value Ref Range   Yeast Wet Prep HPF POC NONE SEEN  NONE SEEN   Trich, Wet Prep NONE SEEN  NONE SEEN   Clue Cells Wet Prep HPF POC FEW (*) NONE SEEN   WBC, Wet Prep HPF POC FEW (*) NONE SEEN    MAU Course  Procedures None  MDM UPT - negative UA, wet prep, GC/Chlamydia, CBC, HIV today Discussed labs and patient presentation with Dr. Elonda Husky. Suggests Rx for Megace, Iron supplement and follow-up in [place].   Assessment and Plan  A: Menometrorrhagia Dysmenorrhea  P: Discharge home Rx for Megace and Ferrous Sulfate given to patient Bleeding precautions discussed Patient referred to [facility]. Appointment given for 04/26/14 Patient may return to MAU as needed or if her condition were to change or worsen  Luvenia Redden, PA-C  04/20/2014, 2:16 AM

## 2014-04-26 ENCOUNTER — Encounter: Payer: Self-pay | Admitting: Obstetrics and Gynecology

## 2014-04-26 ENCOUNTER — Telehealth: Payer: Self-pay | Admitting: *Deleted

## 2014-04-26 ENCOUNTER — Ambulatory Visit: Payer: Self-pay | Admitting: Obstetrics and Gynecology

## 2014-04-26 ENCOUNTER — Encounter: Payer: Self-pay | Admitting: *Deleted

## 2014-04-26 NOTE — Telephone Encounter (Signed)
Heather Graham missed a scheduled appointment from MAU for endometrial biopsy. Called Heather Graham and unable to leave message-heard message this is a nonworking number. Will send letter to patient.

## 2014-06-16 ENCOUNTER — Encounter: Payer: Self-pay | Admitting: General Practice

## 2014-06-19 ENCOUNTER — Encounter (HOSPITAL_COMMUNITY): Payer: Self-pay | Admitting: *Deleted

## 2014-12-31 ENCOUNTER — Encounter (HOSPITAL_COMMUNITY): Payer: Self-pay | Admitting: Emergency Medicine

## 2014-12-31 ENCOUNTER — Emergency Department (HOSPITAL_COMMUNITY)
Admission: EM | Admit: 2014-12-31 | Discharge: 2014-12-31 | Disposition: A | Discharge status: Home / Self Care | Payer: No Typology Code available for payment source | Attending: Emergency Medicine | Admitting: Emergency Medicine

## 2014-12-31 DIAGNOSIS — Z79899 Other long term (current) drug therapy: Secondary | ICD-10-CM | POA: Diagnosis not present

## 2014-12-31 DIAGNOSIS — J45909 Unspecified asthma, uncomplicated: Secondary | ICD-10-CM | POA: Diagnosis present

## 2014-12-31 DIAGNOSIS — Z880 Allergy status to penicillin: Secondary | ICD-10-CM | POA: Insufficient documentation

## 2014-12-31 DIAGNOSIS — J4521 Mild intermittent asthma with (acute) exacerbation: Secondary | ICD-10-CM | POA: Insufficient documentation

## 2014-12-31 DIAGNOSIS — J4520 Mild intermittent asthma, uncomplicated: Secondary | ICD-10-CM

## 2014-12-31 MED ORDER — IPRATROPIUM BROMIDE 0.02 % IN SOLN
0.5000 mg | Freq: Once | RESPIRATORY_TRACT | Status: AC
  Administered 2014-12-31: 0.5 mg via RESPIRATORY_TRACT
  Filled 2014-12-31: qty 2.5

## 2014-12-31 MED ORDER — PREDNISONE 20 MG PO TABS: 60.0000 mg | ORAL_TABLET | Freq: Every day | ORAL | Status: DC

## 2014-12-31 MED ORDER — ALBUTEROL SULFATE HFA 108 (90 BASE) MCG/ACT IN AERS
2.0000 | INHALATION_SPRAY | RESPIRATORY_TRACT | Status: DC | PRN
  Filled 2014-12-31: qty 6.7

## 2014-12-31 MED ORDER — PREDNISONE 20 MG PO TABS
60.0000 mg | ORAL_TABLET | Freq: Once | ORAL | Status: AC
  Administered 2014-12-31: 60 mg via ORAL
  Filled 2014-12-31: qty 3

## 2014-12-31 MED ORDER — ALBUTEROL SULFATE (2.5 MG/3ML) 0.083% IN NEBU
5.0000 mg | INHALATION_SOLUTION | Freq: Once | RESPIRATORY_TRACT | Status: AC
  Administered 2014-12-31: 5 mg via RESPIRATORY_TRACT
  Filled 2014-12-31: qty 6

## 2014-12-31 NOTE — ED Provider Notes (Signed)
CSN: [id number]     Arrival date & time 12/31/14  0416 History   First MD Initiated Contact with Patient 12/31/14 [phone]     Chief Complaint  Patient presents with  . Asthma     (Consider location/radiation/quality/duration/timing/severity/associated sxs/prior Treatment) HPI Comments: She complains of chest tightness and cough that feels like her asthma. She has been out of her inhaler for the past one week. No fever, vomiting, headache. She smoked a cigarette yesterday which caused her symptoms to worsen.   The history is provided by the patient. No language interpreter was used.    Past Medical History  Diagnosis Date  . Asthma    Past Surgical History  Procedure Laterality Date  . Tubal ligation     No family history on file. History  Substance Use Topics  . Smoking status: Never Smoker   . Smokeless tobacco: Never Used  . Alcohol Use: Yes   OB History    Gravida Para Term Preterm AB TAB SAB Ectopic Multiple Living   2 2        2      Review of Systems  Constitutional: Negative for fever and chills.  HENT: Negative.  Negative for rhinorrhea and sore throat.   Respiratory: Positive for cough and shortness of breath.   Cardiovascular: Positive for chest pain.  Gastrointestinal: Negative.   Musculoskeletal: Negative.   Skin: Negative.   Neurological: Negative.  Negative for weakness and light-headedness.      Allergies  Penicillins  Home Medications   Prior to Admission medications   Medication Sig Start Date End Date Taking? Authorizing Provider  albuterol (PROVENTIL HFA;VENTOLIN HFA) 108 (90 BASE) MCG/ACT inhaler Inhale 2 puffs into the lungs every 4 (four) hours as needed for wheezing or shortness of breath. 02/19/13  Yes Sharon Mt [address], MD  Ranitidine HCl (ACID REDUCER PO) Take 1 tablet by mouth daily as needed (for acid reflux).   Yes Historical Provider, MD  ferrous sulfate 325 (65 FE) MG tablet Take 1 tablet (325 mg total) by mouth daily. Patient not  taking: Reported on 12/31/2014 04/20/14   Luvenia Redden, PA-C  LORazepam (ATIVAN) 1 MG tablet Take 1 tablet (1 mg total) by mouth every 6 (six) hours as needed for anxiety. Patient not taking: Reported on 12/31/2014 04/16/14   Cleatrice Burke, PA-C  megestrol (MEGACE) 40 MG tablet Take 2 tabs (40 mg) 3x/day for 3 days, take 2 tabs (40 mg) 2x/day for 3 days , take 2 tabs (40 mg)once per day until follow-up Patient not taking: Reported on 12/31/2014 04/20/14   Luvenia Redden, PA-C   BP 108/93 mmHg  Pulse 82  Temp(Src) 98.1 F (36.7 C) (Oral)  Resp 19  Ht 5\' 6"  (1.676 m)  Wt 298 lb (135.172 kg)  BMI 48.12 kg/m2  SpO2 98%  LMP 12/27/2014 Physical Exam  Constitutional: She is oriented to person, place, and time. She appears well-developed and well-nourished.  HENT:  Head: Normocephalic.  Neck: Normal range of motion. Neck supple.  Cardiovascular: Normal rate and regular rhythm.   Pulmonary/Chest: Effort normal and breath sounds normal. She has no wheezes.  Active coughing on exam.   Abdominal: Soft. Bowel sounds are normal. There is no tenderness. There is no rebound and no guarding.  Musculoskeletal: Normal range of motion.  Neurological: She is alert and oriented to person, place, and time.  Skin: Skin is warm and dry. No rash noted.  Psychiatric: She has a normal mood and affect.  ED Course  Procedures (including critical care time) Labs Review Labs Reviewed - No data to display  Imaging Review No results found.   EKG Interpretation None      MDM   Final diagnoses:  None    1. Asthma  She feels much better after a nebulized treatment with Albuterol and Atrovent. VSS, no hypoxia or tachypnea. Stable for discharge home.     Charlann Lange, PA-C 12/31/14 [place], MD 12/31/14 [phone]

## 2014-12-31 NOTE — Discharge Instructions (Signed)
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
## 2015-04-05 ENCOUNTER — Emergency Department (HOSPITAL_COMMUNITY)
Admission: EM | Admit: 2015-04-05 | Discharge: 2015-04-05 | Disposition: A | Discharge status: Home / Self Care | Payer: No Typology Code available for payment source | Attending: Emergency Medicine | Admitting: Emergency Medicine

## 2015-04-05 ENCOUNTER — Encounter (HOSPITAL_COMMUNITY): Payer: Self-pay | Admitting: Emergency Medicine

## 2015-04-05 DIAGNOSIS — R1084 Generalized abdominal pain: Secondary | ICD-10-CM | POA: Insufficient documentation

## 2015-04-05 DIAGNOSIS — K439 Ventral hernia without obstruction or gangrene: Secondary | ICD-10-CM | POA: Insufficient documentation

## 2015-04-05 DIAGNOSIS — Z9851 Tubal ligation status: Secondary | ICD-10-CM | POA: Insufficient documentation

## 2015-04-05 DIAGNOSIS — Z7952 Long term (current) use of systemic steroids: Secondary | ICD-10-CM | POA: Insufficient documentation

## 2015-04-05 DIAGNOSIS — Z86018 Personal history of other benign neoplasm: Secondary | ICD-10-CM | POA: Insufficient documentation

## 2015-04-05 DIAGNOSIS — Z79899 Other long term (current) drug therapy: Secondary | ICD-10-CM | POA: Insufficient documentation

## 2015-04-05 DIAGNOSIS — R197 Diarrhea, unspecified: Secondary | ICD-10-CM | POA: Insufficient documentation

## 2015-04-05 DIAGNOSIS — J45909 Unspecified asthma, uncomplicated: Secondary | ICD-10-CM | POA: Insufficient documentation

## 2015-04-05 DIAGNOSIS — Z880 Allergy status to penicillin: Secondary | ICD-10-CM | POA: Insufficient documentation

## 2015-04-05 DIAGNOSIS — R1110 Vomiting, unspecified: Secondary | ICD-10-CM | POA: Insufficient documentation

## 2015-04-05 LAB — COMPREHENSIVE METABOLIC PANEL
ALBUMIN: 3.8 g/dL (ref 3.5–5.0)
ALK PHOS: 81 U/L (ref 38–126)
ALT: 12 U/L — AB (ref 14–54)
ANION GAP: 7 (ref 5–15)
AST: 16 U/L (ref 15–41)
BUN: 19 mg/dL (ref 6–20)
CALCIUM: 8.9 mg/dL (ref 8.9–10.3)
CHLORIDE: 106 mmol/L (ref 101–111)
CO2: 24 mmol/L (ref 22–32)
Creatinine, Ser: 1.01 mg/dL — ABNORMAL HIGH (ref 0.44–1.00)
GFR calc non Af Amer: >60 mL/min (ref >60)
GLUCOSE: 92 mg/dL (ref 65–99)
Potassium: 4.1 mmol/L (ref 3.5–5.1)
SODIUM: 137 mmol/L (ref 135–145)
Total Bilirubin: 0.4 mg/dL (ref 0.3–1.2)
Total Protein: 8.3 g/dL — ABNORMAL HIGH (ref 6.5–8.1)

## 2015-04-05 LAB — CBC
HCT: 27 % — ABNORMAL LOW (ref 36.0–46.0)
HEMOGLOBIN: 7.6 g/dL — AB (ref 12.0–15.0)
MCH: 19.3 pg — AB (ref 26.0–34.0)
MCHC: 28.1 g/dL — AB (ref 30.0–36.0)
MCV: 68.5 fL — AB (ref 78.0–100.0)
Platelets: 463 10*3/uL — ABNORMAL HIGH (ref 150–400)
RBC: 3.94 MIL/uL (ref 3.87–5.11)
RDW: 19.4 % — ABNORMAL HIGH (ref 11.5–15.5)
WBC: 8.4 10*3/uL (ref 4.0–10.5)

## 2015-04-05 LAB — LIPASE, BLOOD: LIPASE: 23 U/L (ref 22–51)

## 2015-04-05 MED ORDER — DICYCLOMINE HCL 20 MG PO TABS: 20.0000 mg | ORAL_TABLET | Freq: Three times a day (TID) | ORAL | Status: DC

## 2015-04-05 MED ORDER — DICYCLOMINE HCL 20 MG PO TABS
10.0000 mg | ORAL_TABLET | Freq: Once | ORAL | Status: AC
  Administered 2015-04-05: 10 mg via ORAL
  Filled 2015-04-05: qty 1

## 2015-04-05 NOTE — ED Provider Notes (Signed)
CSN: [id number]     Arrival date & time 04/05/15  2047 History   First MD Initiated Contact with Patient 04/05/15 2206     Chief Complaint  Patient presents with  . Abdominal Pain     (Consider location/radiation/quality/duration/timing/severity/associated sxs/prior Treatment) HPI Comments: 39 year old female with history of asthma and fibroids who presents with abdominal pain. The patient states that she has had intermittent episodes of generalized abdominal pain for the last week. She had some vomiting and diarrhea and then later felt like her symptoms were improving. She has been eating normally, however she had a return of vomiting and diarrhea overnight last night. She continues to have intermittent episodes of severe abdominal cramping which feels like a pressure and is over her entire abdomen. Last episode of vomiting was last night. Her friends think that she is eating too much at work as she states she had 3 sandwiches for lunch and a large serving of spaghetti for dinner. She denies any fevers, urinary symptoms, chest pain, or shortness of breath. No vaginal discharge.  Of note, the patient has a known history of uterine fibroids and states that she needs to schedule surgery w/ OBGYN.  Patient is a 39 y.o. female presenting with abdominal pain. The history is provided by the patient.  Abdominal Pain   Past Medical History  Diagnosis Date  . Asthma    Past Surgical History  Procedure Laterality Date  . Tubal ligation     History reviewed. No pertinent family history. Social History  Substance Use Topics  . Smoking status: Never Smoker   . Smokeless tobacco: Never Used  . Alcohol Use: Yes   OB History    Gravida Para Term Preterm AB TAB SAB Ectopic Multiple Living   2 2        2      Review of Systems  Gastrointestinal: Positive for abdominal pain.   10 Systems reviewed and are negative for acute change except as noted in the HPI.    Allergies  Penicillins  Home  Medications   Prior to Admission medications   Medication Sig Start Date End Date Taking? Authorizing Provider  albuterol (PROVENTIL HFA;VENTOLIN HFA) 108 (90 BASE) MCG/ACT inhaler Inhale 2 puffs into the lungs every 4 (four) hours as needed for wheezing or shortness of breath. 02/19/13   [facility], MD  ferrous sulfate 325 (65 FE) MG tablet Take 1 tablet (325 mg total) by mouth daily. Patient not taking: Reported on 12/31/2014 04/20/14   Luvenia Redden, PA-C  LORazepam (ATIVAN) 1 MG tablet Take 1 tablet (1 mg total) by mouth every 6 (six) hours as needed for anxiety. Patient not taking: Reported on 12/31/2014 04/16/14   Cleatrice Burke, PA-C  megestrol (MEGACE) 40 MG tablet Take 2 tabs (40 mg) 3x/day for 3 days, take 2 tabs (40 mg) 2x/day for 3 days , take 2 tabs (40 mg)once per day until follow-up Patient not taking: Reported on 12/31/2014 04/20/14   Luvenia Redden, PA-C  predniSONE (DELTASONE) 20 MG tablet Take 3 tablets (60 mg total) by mouth daily. 12/31/14   Charlann Lange, PA-C  Ranitidine HCl (ACID REDUCER PO) Take 1 tablet by mouth daily as needed (for acid reflux).    Historical Provider, MD   BP 144/62 mmHg  Pulse 102  Temp(Src) 98.5 F (36.9 C) (Oral)  Resp 22  SpO2 98%  LMP 03/14/2015 Physical Exam  Constitutional: She is oriented to person, place, and time. She appears well-developed and well-nourished.  No distress.  HENT:  Head: Normocephalic and atraumatic.  Moist mucous membranes  Eyes: Conjunctivae are normal. Pupils are equal, round, and reactive to light.  Neck: Neck supple.  Cardiovascular: Normal rate, regular rhythm and normal heart sounds.   No murmur heard. Pulmonary/Chest: Effort normal and breath sounds normal.  Abdominal: Soft. Bowel sounds are normal. She exhibits no distension. There is no tenderness.  Ventral abdominal hernia, reducible  Musculoskeletal: She exhibits no edema.  Neurological: She is alert and oriented to person, place, and time.  Fluent  speech  Skin: Skin is warm and dry.  Psychiatric:  Animated, racing thoughts, frequent laughter  Nursing note and vitals reviewed.   ED Course  Procedures (including critical care time) Labs Review Labs Reviewed  COMPREHENSIVE METABOLIC PANEL - Abnormal; Notable for the following:    Creatinine, Ser 1.01 (*)    Total Protein 8.3 (*)    ALT 12 (*)    All other components within normal limits  CBC - Abnormal; Notable for the following:    Hemoglobin 7.6 (*)    HCT 27.0 (*)    MCV 68.5 (*)    MCH 19.3 (*)    MCHC 28.1 (*)    RDW 19.4 (*)    Platelets 463 (*)    All other components within normal limits  LIPASE, BLOOD  URINALYSIS, ROUTINE W REFLEX MICROSCOPIC (NOT AT [facility])    Imaging Review No results found. I have personally reviewed and evaluated these lab results as part of my medical decision-making.   EKG Interpretation None      MDM   Final diagnoses:  None   generalized abdominal pain Anemia History of uterine fibroids  39 year old female who presents with 1 week of generalized abdominal pain associated with intermittent episodes of vomiting and diarrhea. Patient well-appearing at presentation. Afebrile. No abdominal tenderness on exam. The patient was animated and frequently laughs during conversation, joking that her friends told her that her abdominal pain was from eating so much today. She has had multiple large meals and has not had any vomiting since last night. She has no abdominal tenderness on exam. Obtained labs listed above which are notable for anemia with hemoglobin of 7.6. The patient has a known history of uterine fibroids with plans to follow-up with OB/GYN for surgical resection. As she is not currently endorsing any symptoms of anemia, I instructed her to schedule follow-up with OB/GYN. Regarding her abdominal pain, she has been eating with no evidence of obstruction and no other infectious symptoms. I suspect that she may have had a viral  gastroenteritis and has overeaten while her GI flora has not fully recolonized. Gave the patient Bentyl and instructed on supportive care at home including small meals and avoidance of greasy, spicy, or difficulty to digest foods. Reviewed return precautions and patient was discharged in satisfactory condition.  Sharlett Iles, MD 04/05/15 [phone]

## 2015-04-05 NOTE — Discharge Instructions (Signed)

## 2015-04-05 NOTE — ED Notes (Signed)
Pt arrived to the ED with a complaint of abdominal pain.  Pt states pain has been present for a week.  Pain is located on the upper abdominal.  Pt states pain is cramping and pressure feeling.

## 2015-07-26 ENCOUNTER — Emergency Department (HOSPITAL_COMMUNITY)
Admission: EM | Admit: 2015-07-26 | Discharge: 2015-07-26 | Disposition: A | Discharge status: Home / Self Care | Payer: No Typology Code available for payment source | Attending: Physician Assistant | Admitting: Physician Assistant

## 2015-07-26 ENCOUNTER — Encounter (HOSPITAL_COMMUNITY): Payer: Self-pay

## 2015-07-26 ENCOUNTER — Emergency Department (HOSPITAL_COMMUNITY): Payer: No Typology Code available for payment source

## 2015-07-26 DIAGNOSIS — Z880 Allergy status to penicillin: Secondary | ICD-10-CM | POA: Insufficient documentation

## 2015-07-26 DIAGNOSIS — J189 Pneumonia, unspecified organism: Secondary | ICD-10-CM

## 2015-07-26 DIAGNOSIS — J159 Unspecified bacterial pneumonia: Secondary | ICD-10-CM | POA: Insufficient documentation

## 2015-07-26 DIAGNOSIS — Z79899 Other long term (current) drug therapy: Secondary | ICD-10-CM | POA: Insufficient documentation

## 2015-07-26 DIAGNOSIS — J45901 Unspecified asthma with (acute) exacerbation: Secondary | ICD-10-CM | POA: Insufficient documentation

## 2015-07-26 MED ORDER — AEROCHAMBER PLUS FLO-VU MEDIUM MISC
1.0000 | Freq: Once | Status: DC
  Filled 2015-07-26 (×2): qty 1

## 2015-07-26 MED ORDER — IPRATROPIUM-ALBUTEROL 0.5-2.5 (3) MG/3ML IN SOLN
3.0000 mL | Freq: Once | RESPIRATORY_TRACT | Status: AC
  Administered 2015-07-26: 3 mL via RESPIRATORY_TRACT
  Filled 2015-07-26: qty 3

## 2015-07-26 MED ORDER — AZITHROMYCIN 250 MG PO TABS: ORAL_TABLET | ORAL | Status: DC

## 2015-07-26 MED ORDER — PROMETHAZINE-DM 6.25-15 MG/5ML PO SYRP: 5.0000 mL | ORAL_SOLUTION | Freq: Four times a day (QID) | ORAL | Status: DC | PRN

## 2015-07-26 MED ORDER — GUAIFENESIN ER 1200 MG PO TB12: 1.0000 | ORAL_TABLET | Freq: Two times a day (BID) | ORAL | Status: DC

## 2015-07-26 MED ORDER — CEFTRIAXONE SODIUM 250 MG IJ SOLR
1.0000 g | Freq: Once | INTRAMUSCULAR | Status: AC
  Administered 2015-07-26: 1 g via INTRAMUSCULAR
  Filled 2015-07-26: qty 1000

## 2015-07-26 MED ORDER — ALBUTEROL SULFATE HFA 108 (90 BASE) MCG/ACT IN AERS
2.0000 | INHALATION_SPRAY | RESPIRATORY_TRACT | Status: DC | PRN
  Administered 2015-07-26: 2 via RESPIRATORY_TRACT
  Filled 2015-07-26: qty 6.7

## 2015-07-26 MED ORDER — STERILE WATER FOR INJECTION IJ SOLN
INTRAMUSCULAR | Status: AC
  Administered 2015-07-26: 10 mL
  Filled 2015-07-26: qty 10

## 2015-07-26 NOTE — Discharge Instructions (Signed)
Return here as needed. Follow up with a primary doctor. Increase your fluid intake. Return here as needed.

## 2015-07-26 NOTE — ED Notes (Signed)
ED PA at bedside

## 2015-07-26 NOTE — ED Notes (Signed)
[facility] PA cancelled [facility] bed assigned. Hospitalist [facility] will see pt and assign bed here at [facility].

## 2015-07-26 NOTE — ED Notes (Signed)
Pt here with cough/shortness of breath x 1 week.  Pt using vicks vapor rub.  No fever.  Pt does have asthma

## 2015-07-26 NOTE — ED Provider Notes (Signed)
CSN: [id number]     Arrival date & time 07/26/15  [id number] History   First MD Initiated Contact with Patient 07/26/15 [phone]     Chief Complaint  Patient presents with  . Cough  . Shortness of Breath    HPI  Patient is a 39 y/o AAF with a PMH of asthma who presents today with one week of productive cough and SOB.  Cough is productive of green sputum, worse at night.  Last Thursday she had sudden onset pleuritic L-sided CP.  She has had diffuse tenderness of the chest wall since its onset.  Endorses rhinorrhea, sinus congestion, headache, and "scratchy" throat.  Denies fever or smoking history, although she endorses daily exposure to second-hand smoke. She had post-tussive emesis x1 today.  She has recently ran out of her albuterol.    Past Medical History  Diagnosis Date  . Asthma    Past Surgical History  Procedure Laterality Date  . Tubal ligation     History reviewed. No pertinent family history. Social History  Substance Use Topics  . Smoking status: Never Smoker   . Smokeless tobacco: Never Used  . Alcohol Use: Yes   OB History    Gravida Para Term Preterm AB TAB SAB Ectopic Multiple Living   2 2        2      Review of Systems All other systems negative except as documented in the HPI. All pertinent positives and negatives as reviewed in the HPI.    Allergies  Penicillins  Home Medications   Prior to Admission medications   Medication Sig Start Date End Date Taking? Authorizing Provider  acetaminophen (TYLENOL) 500 MG tablet Take 2,000 mg by mouth daily as needed for moderate pain or headache.   Yes Historical Provider, MD  albuterol (PROVENTIL HFA;VENTOLIN HFA) 108 (90 BASE) MCG/ACT inhaler Inhale 2 puffs into the lungs every 4 (four) hours as needed for wheezing or shortness of breath. 02/19/13  Yes Sharon Mt Street, MD  diphenhydrAMINE (BENADRYL) 25 MG tablet Take 50 mg by mouth at bedtime as needed for sleep.   Yes Historical Provider, MD  Ranitidine HCl (ACID  REDUCER PO) Take 1 tablet by mouth daily as needed (for acid reflux).   Yes Historical Provider, MD  dicyclomine (BENTYL) 20 MG tablet Take 1 tablet (20 mg total) by mouth 3 (three) times daily before meals. 04/05/15   Sharlett Iles, MD  ferrous sulfate 325 (65 FE) MG tablet Take 1 tablet (325 mg total) by mouth daily. Patient not taking: Reported on 12/31/2014 04/20/14   Luvenia Redden, PA-C  LORazepam (ATIVAN) 1 MG tablet Take 1 tablet (1 mg total) by mouth every 6 (six) hours as needed for anxiety. Patient not taking: Reported on 12/31/2014 04/16/14   Cleatrice Burke, PA-C  megestrol (MEGACE) 40 MG tablet Take 2 tabs (40 mg) 3x/day for 3 days, take 2 tabs (40 mg) 2x/day for 3 days , take 2 tabs (40 mg)once per day until follow-up Patient not taking: Reported on 12/31/2014 04/20/14   Luvenia Redden, PA-C   BP 122/66 mmHg  Pulse 86  Temp(Src) 98.1 F (36.7 C) (Oral)  Resp 13  SpO2 98%  LMP 07/20/2015   Physical Exam  Constitutional: She is oriented to person, place, and time. She appears well-developed and well-nourished. No distress.  HENT:  Nose: Rhinorrhea present. Right sinus exhibits maxillary sinus tenderness and frontal sinus tenderness. Left sinus exhibits maxillary sinus tenderness and frontal sinus tenderness.  Mouth/Throat:  Oropharynx is clear and moist. No oropharyngeal exudate.  Eyes: Pupils are equal, round, and reactive to light.  Neck: Normal range of motion. Neck supple.  Cardiovascular: Normal rate, regular rhythm and normal heart sounds.  Exam reveals no gallop and no friction rub.   No murmur heard. Pulmonary/Chest: Effort normal and breath sounds normal. No respiratory distress. She has no wheezes. She has no rales.  Musculoskeletal: She exhibits no edema.  Neurological: She is alert and oriented to person, place, and time. She exhibits normal muscle tone. Coordination normal.  Skin: Skin is warm and dry.  Psychiatric: She has a normal mood and affect. Her behavior is  normal.  Nursing note and vitals reviewed.   ED Course  Procedures (including critical care time) Labs Review Labs Reviewed - No data to display  Imaging Review Dg Chest 2 View  07/26/2015  CLINICAL DATA:  Cough. EXAM: CHEST  2 VIEW COMPARISON:  09/13/2013. FINDINGS: Mediastinum hilar structures are normal. Low lung volumes with mild bibasilar atelectasis. Mild left infrahilar infiltrate cannot be excluded. No pleural effusion or pneumothorax. Heart size stable. No acute bony abnormality IMPRESSION: Low lung volumes with mild bibasilar atelectasis. Mild left infrahilar infiltrate cannot be excluded. Electronically Signed   By: Marcello Moores  Register   On: 07/26/2015 08:36   I have personally reviewed and evaluated these images and lab results as part of my medical decision-making.   EKG Interpretation None      Assessment & Plan  CXR significant for mild leftinfrahilar infiltrate cannot be excluded -  educated the patient on the importance of finding a PCP for asthma management  Patient be treated for community-acquired pneumonia told to return here as needed.  Patient agrees the plan and all questions were answered.  She was given Rocephin and Zithromax  Dalia Heading, PA-C 07/26/15 [place], MD 07/27/15 1650

## 2015-07-26 NOTE — ED Notes (Signed)
MD at bedside. 

## 2015-07-27 NOTE — Progress Notes (Signed)
Pt was seen by [facility] staff prior to leaving [facility] ED on 07/26/15 Pt givne uninsured resources including pcp

## 2015-09-19 ENCOUNTER — Emergency Department (HOSPITAL_COMMUNITY)
Admission: EM | Admit: 2015-09-19 | Discharge: 2015-09-19 | Disposition: A | Discharge status: Home / Self Care | Payer: No Typology Code available for payment source | Attending: Emergency Medicine | Admitting: Emergency Medicine

## 2015-09-19 ENCOUNTER — Encounter (HOSPITAL_COMMUNITY): Payer: Self-pay

## 2015-09-19 DIAGNOSIS — R509 Fever, unspecified: Secondary | ICD-10-CM

## 2015-09-19 DIAGNOSIS — J069 Acute upper respiratory infection, unspecified: Secondary | ICD-10-CM | POA: Insufficient documentation

## 2015-09-19 DIAGNOSIS — Z79899 Other long term (current) drug therapy: Secondary | ICD-10-CM | POA: Insufficient documentation

## 2015-09-19 DIAGNOSIS — R110 Nausea: Secondary | ICD-10-CM | POA: Insufficient documentation

## 2015-09-19 DIAGNOSIS — J45909 Unspecified asthma, uncomplicated: Secondary | ICD-10-CM | POA: Insufficient documentation

## 2015-09-19 DIAGNOSIS — R000 Tachycardia, unspecified: Secondary | ICD-10-CM | POA: Insufficient documentation

## 2015-09-19 DIAGNOSIS — Z880 Allergy status to penicillin: Secondary | ICD-10-CM | POA: Insufficient documentation

## 2015-09-19 DIAGNOSIS — R61 Generalized hyperhidrosis: Secondary | ICD-10-CM | POA: Insufficient documentation

## 2015-09-19 LAB — RAPID STREP SCREEN (MED CTR MEBANE ONLY): STREPTOCOCCUS, GROUP A SCREEN (DIRECT): NEGATIVE

## 2015-09-19 MED ORDER — ACETAMINOPHEN 325 MG PO TABS
650.0000 mg | ORAL_TABLET | Freq: Once | ORAL | Status: DC
  Filled 2015-09-19: qty 2

## 2015-09-19 MED ORDER — ACETAMINOPHEN 160 MG/5ML PO SOLN
650.0000 mg | Freq: Once | ORAL | Status: AC
  Administered 2015-09-19: 650 mg via ORAL
  Filled 2015-09-19: qty 20.3

## 2015-09-19 NOTE — ED Notes (Signed)
Informed patient with a temperature at her elevation, I could give her tylenol. Patient states she does not want tylenol PO. States, "I need an IV" Informed patient that we will have to wait on doctor to see her for further orders.

## 2015-09-19 NOTE — Discharge Instructions (Signed)
Fever, Adult A fever is an increase in the body's temperature. It is usually defined as a temperature of 100F (38C) or higher. Brief mild or moderate fevers generally have no long-term effects, and they often do not require treatment. Moderate or high fevers may make you feel uncomfortable and can sometimes be a sign of a serious illness or disease. The sweating that may occur with repeated or prolonged fever may also cause dehydration. Fever is confirmed by taking a temperature with a thermometer. A measured temperature can vary with:  Age.  Time of day.  Location of the thermometer:  Mouth (oral).  Rectum (rectal).  Ear (tympanic).  Underarm (axillary).  Forehead (temporal). HOME CARE INSTRUCTIONS Pay attention to any changes in your symptoms. Take these actions to help with your condition:  Take over-the counter and prescription medicines only as told by your health care provider. Follow the dosing instructions carefully.  If you were prescribed an antibiotic medicine, take it as told by your health care provider. Do not stop taking the antibiotic even if you start to feel better.  Rest as needed.  Drink enough fluid to keep your urine clear or pale yellow. This helps to prevent dehydration.  Sponge yourself or bathe with room-temperature water to help reduce your body temperature as needed. Do not use ice water.  Do not overbundle yourself in blankets or heavy clothes. SEEK MEDICAL CARE IF:  You vomit.  You cannot eat or drink without vomiting.  You have diarrhea.  You have pain when you urinate.  Your symptoms do not improve with treatment.  You develop new symptoms.  You develop excessive weakness. SEEK IMMEDIATE MEDICAL CARE IF:  You have shortness of breath or have trouble breathing.  You are dizzy or you faint.  You are disoriented or confused.  You develop signs of dehydration, such as a dry mouth, decreased urination, or paleness.  You develop  severe pain in your abdomen.  You have persistent vomiting or diarrhea.  You develop a skin rash.  Your symptoms suddenly get worse.   This information is not intended to replace advice given to you by your health care provider. Make sure you discuss any questions you have with your health care provider.   Document Released: 01/28/2001 Document Revised: 04/25/2015 Document Reviewed: 09/28/2014 Elsevier Interactive Patient Education 2016 [employer] Upper Respiratory Infection, Adult Most upper respiratory infections (URIs) are a viral infection of the air passages leading to the lungs. A URI affects the nose, throat, and upper air passages. The most common type of URI is nasopharyngitis and is typically referred to as "the common cold." URIs run their course and usually go away on their own. Most of the time, a URI does not require medical attention, but sometimes a bacterial infection in the upper airways can follow a viral infection. This is called a secondary infection. Sinus and middle ear infections are common types of secondary upper respiratory infections. Bacterial pneumonia can also complicate a URI. A URI can worsen asthma and chronic obstructive pulmonary disease (COPD). Sometimes, these complications can require emergency medical care and may be life threatening.  CAUSES Almost all URIs are caused by viruses. A virus is a type of germ and can spread from one person to another.  RISKS FACTORS You may be at risk for a URI if:   You smoke.   You have chronic heart or lung disease.  You have a weakened defense (immune) system.   You are very young or  very old.   You have nasal allergies or asthma.  You work in crowded or poorly ventilated areas.  You work in health care facilities or schools. SIGNS AND SYMPTOMS  Symptoms typically develop 2-3 days after you come in contact with a cold virus. Most viral URIs last 7-10 days. However, viral URIs from the influenza virus  (flu virus) can last 14-18 days and are typically more severe. Symptoms may include:   Runny or stuffy (congested) nose.   Sneezing.   Cough.   Sore throat.   Headache.   Fatigue.   Fever.   Loss of appetite.   Pain in your forehead, behind your eyes, and over your cheekbones (sinus pain).  Muscle aches.  DIAGNOSIS  Your health care provider may diagnose a URI by:  Physical exam.  Tests to check that your symptoms are not due to another condition such as:  Strep throat.  Sinusitis.  Pneumonia.  Asthma. TREATMENT  A URI goes away on its own with time. It cannot be cured with medicines, but medicines may be prescribed or recommended to relieve symptoms. Medicines may help:  Reduce your fever.  Reduce your cough.  Relieve nasal congestion. HOME CARE INSTRUCTIONS   Take medicines only as directed by your health care provider.   Gargle warm saltwater or take cough drops to comfort your throat as directed by your health care provider.  Use a warm mist humidifier or inhale steam from a shower to increase air moisture. This may make it easier to breathe.  Drink enough fluid to keep your urine clear or pale yellow.   Eat soups and other clear broths and maintain good nutrition.   Rest as needed.   Return to work when your temperature has returned to normal or as your health care provider advises. You may need to stay home longer to avoid infecting others. You can also use a face mask and careful hand washing to prevent spread of the virus.  Increase the usage of your inhaler if you have asthma.   Do not use any tobacco products, including cigarettes, chewing tobacco, or electronic cigarettes. If you need help quitting, ask your health care provider. PREVENTION  The best way to protect yourself from getting a cold is to practice good hygiene.   Avoid oral or hand contact with people with cold symptoms.   Wash your hands often if contact occurs.   There is no clear evidence that vitamin C, vitamin E, echinacea, or exercise reduces the chance of developing a cold. However, it is always recommended to get plenty of rest, exercise, and practice good nutrition.  SEEK MEDICAL CARE IF:   You are getting worse rather than better.   Your symptoms are not controlled by medicine.   You have chills.  You have worsening shortness of breath.  You have brown or red mucus.  You have yellow or brown nasal discharge.  You have pain in your face, especially when you bend forward.  You have a fever.  You have swollen neck glands.  You have pain while swallowing.  You have white areas in the back of your throat. SEEK IMMEDIATE MEDICAL CARE IF:   You have severe or persistent:  Headache.  Ear pain.  Sinus pain.  Chest pain.  You have chronic lung disease and any of the following:  Wheezing.  Prolonged cough.  Coughing up blood.  A change in your usual mucus.  You have a stiff neck.  You have changes in  your:  Vision.  Hearing.  Thinking.  Mood. MAKE SURE YOU:   Understand these instructions.  Will watch your condition.  Will get help right away if you are not doing well or get worse.   This information is not intended to replace advice given to you by your health care provider. Make sure you discuss any questions you have with your health care provider.   Document Released: 01/28/2001 Document Revised: 12/19/2014 Document Reviewed: 11/09/2013 Elsevier Interactive Patient Education [employer].

## 2015-09-19 NOTE — ED Notes (Signed)
Discharge instructions and follow up care reviewed with patient. Patient verbalized understanding. 

## 2015-09-19 NOTE — ED Notes (Signed)
Pt complains of a fever and sore throat for three days

## 2015-09-19 NOTE — ED Provider Notes (Signed)
CSN: [id number]     Arrival date & time 09/19/15  0251 History   First MD Initiated Contact with Patient 09/19/15 [phone]     Chief Complaint  Patient presents with  . Fever  . Sore Throat     (Consider location/radiation/quality/duration/timing/severity/associated sxs/prior Treatment) HPI Comments: Fever and ST x 3 days. Mild congestion, productive cough x 2 days. Previous diagnosis of "walking pneumonia" around the first of January (4 weeks ago) and never filled the prescription. Symptoms of cough and fever at that time resolved in the interim. She states she is having nausea without vomiting, sweats, diarrhea. No rash. She has a history of asthma but does not feel she is wheezing with current illness.   Patient is a 40 y.o. female presenting with fever and pharyngitis. The history is provided by the patient. No language interpreter was used.  Fever Associated symptoms: chills, congestion, cough, myalgias, nausea and sore throat   Associated symptoms: no dysuria, no rash and no vomiting   Sore Throat Associated symptoms include chills, congestion, coughing, diaphoresis, a fever, myalgias, nausea and a sore throat. Pertinent negatives include no rash, vomiting or weakness.    Past Medical History  Diagnosis Date  . Asthma    Past Surgical History  Procedure Laterality Date  . Tubal ligation     History reviewed. No pertinent family history. Social History  Substance Use Topics  . Smoking status: Never Smoker   . Smokeless tobacco: Never Used  . Alcohol Use: Yes   OB History    Gravida Para Term Preterm AB TAB SAB Ectopic Multiple Living   2 2        2      Review of Systems  Constitutional: Positive for fever, chills and diaphoresis.  HENT: Positive for congestion, sinus pressure and sore throat.   Respiratory: Positive for cough. Negative for shortness of breath.   Gastrointestinal: Positive for nausea. Negative for vomiting.  Genitourinary: Negative.  Negative for dysuria.   Musculoskeletal: Positive for myalgias. Negative for neck stiffness.  Skin: Negative for rash.  Neurological: Negative for syncope and weakness.      Allergies  Penicillins  Home Medications   Prior to Admission medications   Medication Sig Start Date End Date Taking? Authorizing Provider  albuterol (PROVENTIL HFA;VENTOLIN HFA) 108 (90 BASE) MCG/ACT inhaler Inhale 2 puffs into the lungs every 4 (four) hours as needed for wheezing or shortness of breath. 02/19/13  Yes Sharon Mt [address], MD  diphenhydrAMINE (BENADRYL) 25 MG tablet Take 50 mg by mouth at bedtime as needed for sleep.   Yes Historical Provider, MD  azithromycin (ZITHROMAX Z-PAK) 250 MG tablet 2 PO day one then 1 PO day 2-5 Patient not taking: Reported on 09/19/2015 07/26/15   Dalia Heading, PA-C  dicyclomine (BENTYL) 20 MG tablet Take 1 tablet (20 mg total) by mouth 3 (three) times daily before meals. Patient not taking: Reported on 09/19/2015 04/05/15   Sharlett Iles, MD  ferrous sulfate 325 (65 FE) MG tablet Take 1 tablet (325 mg total) by mouth daily. Patient not taking: Reported on 12/31/2014 04/20/14   Luvenia Redden, PA-C  Guaifenesin 1200 MG TB12 Take 1 tablet (1,200 mg total) by mouth 2 (two) times daily. Patient not taking: Reported on 09/19/2015 07/26/15   Dalia Heading, PA-C  LORazepam (ATIVAN) 1 MG tablet Take 1 tablet (1 mg total) by mouth every 6 (six) hours as needed for anxiety. Patient not taking: Reported on 12/31/2014 04/16/14   Cleatrice Burke, PA-C  megestrol (MEGACE) 40 MG tablet Take 2 tabs (40 mg) 3x/day for 3 days, take 2 tabs (40 mg) 2x/day for 3 days , take 2 tabs (40 mg)once per day until follow-up Patient not taking: Reported on 12/31/2014 04/20/14   Luvenia Redden, PA-C  promethazine-dextromethorphan (PROMETHAZINE-DM) 6.25-15 MG/5ML syrup Take 5 mLs by mouth 4 (four) times daily as needed for cough. Patient not taking: Reported on 09/19/2015 07/26/15   Dalia Heading, PA-C   BP 131/116 mmHg   Pulse 133  Temp(Src) 101 F (38.3 C) (Oral)  Resp 22  Ht 5\' 5"  (1.651 m)  Wt 179.171 kg  BMI 65.73 kg/m2  SpO2 99%  LMP 09/18/2015 Physical Exam  Constitutional: She is oriented to person, place, and time. She appears well-developed and well-nourished.  HENT:  Head: Normocephalic.  Right Ear: External ear normal.  Left Ear: External ear normal.  Mouth/Throat: No oropharyngeal exudate.  Right greater than left tonsillar swelling without exudates. Uvula midline.   Eyes: Conjunctivae are normal.  Neck: Normal range of motion. Neck supple.  Cardiovascular: Regular rhythm.  Tachycardia present.   No murmur heard. Pulmonary/Chest: Effort normal and breath sounds normal. She has no wheezes. She has no rales. She exhibits no tenderness.  Abdominal: Soft. Bowel sounds are normal. There is no tenderness. There is no rebound and no guarding.  Musculoskeletal: Normal range of motion.  Lymphadenopathy:    She has cervical adenopathy.  Neurological: She is alert and oriented to person, place, and time.  Skin: Skin is warm and dry. No rash noted.  Psychiatric: She has a normal mood and affect.    ED Course  Procedures (including critical care time) Labs Review Labs Reviewed - No data to display  Imaging Review No results found. I have personally reviewed and evaluated these images and lab results as part of my medical decision-making.   EKG Interpretation None      MDM   Final diagnoses:  None    1. Febrile illness  The patient presents with flu like symptoms of body aches, fever, cough, congestion, sore throat. She has nausea without vomiting.   Fever treated with Tylenol, she is drinking PO fluids. Strep pending. Will observe for improvement in vitals, less tachycardia.   5:30 - Vital signs improved. Tachycardia resolved, blood pressure decreased, no tachypnea. Patient is feeling much better. Stable for discharge.   Charlann Lange, PA-C 09/19/15 [place],  MD 09/19/15 [phone]

## 2015-09-21 LAB — CULTURE, GROUP A STREP (THRC)

## 2015-09-25 ENCOUNTER — Encounter (HOSPITAL_COMMUNITY): Payer: Self-pay

## 2015-09-25 ENCOUNTER — Emergency Department (HOSPITAL_COMMUNITY)
Admission: EM | Admit: 2015-09-25 | Discharge: 2015-09-25 | Disposition: A | Discharge status: Home / Self Care | Payer: No Typology Code available for payment source | Attending: Dermatology | Admitting: Dermatology

## 2015-09-25 ENCOUNTER — Emergency Department (HOSPITAL_COMMUNITY)
Admission: EM | Admit: 2015-09-25 | Discharge: 2015-09-25 | Discharge status: Against Medical Advice | Payer: Self-pay | Attending: Emergency Medicine | Admitting: Emergency Medicine

## 2015-09-25 ENCOUNTER — Emergency Department (HOSPITAL_COMMUNITY): Payer: No Typology Code available for payment source

## 2015-09-25 DIAGNOSIS — Z880 Allergy status to penicillin: Secondary | ICD-10-CM | POA: Insufficient documentation

## 2015-09-25 DIAGNOSIS — F911 Conduct disorder, childhood-onset type: Secondary | ICD-10-CM | POA: Insufficient documentation

## 2015-09-25 DIAGNOSIS — J45901 Unspecified asthma with (acute) exacerbation: Secondary | ICD-10-CM | POA: Insufficient documentation

## 2015-09-25 DIAGNOSIS — Z79899 Other long term (current) drug therapy: Secondary | ICD-10-CM | POA: Insufficient documentation

## 2015-09-25 DIAGNOSIS — J45909 Unspecified asthma, uncomplicated: Secondary | ICD-10-CM | POA: Insufficient documentation

## 2015-09-25 DIAGNOSIS — Z8701 Personal history of pneumonia (recurrent): Secondary | ICD-10-CM | POA: Insufficient documentation

## 2015-09-25 MED ORDER — ACETAMINOPHEN 325 MG PO TABS
650.0000 mg | ORAL_TABLET | Freq: Once | ORAL | Status: AC | PRN
  Administered 2015-09-25: 650 mg via ORAL

## 2015-09-25 MED ORDER — ALBUTEROL SULFATE (2.5 MG/3ML) 0.083% IN NEBU
5.0000 mg | INHALATION_SOLUTION | Freq: Once | RESPIRATORY_TRACT | Status: AC
  Administered 2015-09-25: 5 mg via RESPIRATORY_TRACT

## 2015-09-25 MED ORDER — ALBUTEROL SULFATE (2.5 MG/3ML) 0.083% IN NEBU
INHALATION_SOLUTION | RESPIRATORY_TRACT | Status: AC
  Filled 2015-09-25: qty 6

## 2015-09-25 MED ORDER — ALBUTEROL SULFATE (2.5 MG/3ML) 0.083% IN NEBU
5.0000 mg | INHALATION_SOLUTION | Freq: Once | RESPIRATORY_TRACT | Status: AC
  Administered 2015-09-25: 5 mg via RESPIRATORY_TRACT
  Filled 2015-09-25: qty 6

## 2015-09-25 MED ORDER — ACETAMINOPHEN 325 MG PO TABS
ORAL_TABLET | ORAL | Status: AC
  Filled 2015-09-25: qty 2

## 2015-09-25 MED ORDER — ACETAMINOPHEN 325 MG PO TABS
650.0000 mg | ORAL_TABLET | Freq: Once | ORAL | Status: AC | PRN
  Administered 2015-09-25: 650 mg via ORAL
  Filled 2015-09-25: qty 2

## 2015-09-25 NOTE — ED Notes (Signed)
Pt states that she was here a week ago and that she received "no treatment". Said she was here for shortness of breath, high fever and asthma.  Stated no breathing tx given.  Had told provider that she did not have an inhaler at home and no one gave her a new script.  Pt noted she has strep throat and that she was not given any antibiotic for this.  She filled a z-pack script that she was given in December for pneumonia that she did not fill.  Pt again stated her fever is high today.  Pt does have low grade temp today of 99.9.

## 2015-09-25 NOTE — ED Notes (Signed)
Pt is rude and aggressive with staff; this RN attempts to triage this pt who state "I have already told you what was wrong and I am not telling you again"; I advised pt that I have not been in the room and that I need to ask these questions to put in the triage assessment; pt states "you have an attitude and you are yelling at me" RN advised that I am here to help and that I cannot help her until I get the triage information in; pt states "I am not doing this again you can look at the notes from earlier"; RN advised that I cannot see the notes from earlier and that I need to ask these questions in order to get the information; pt states "I have a fever of 108" RN advised that her temp was 100.8 pt states "Get out and I will file a complaint about you" RN left the room per pt's request

## 2015-09-25 NOTE — ED Notes (Signed)
Patient was asked what was going on tonight. Patient started yelling saying she was not going to be talked to like that and that she is not going to be treated bad. She states she was her three times today. Patient states that she was 108.0 degrees. Patient would not tell why she was here and how we could help her.

## 2015-09-25 NOTE — ED Notes (Signed)
Pt here for asthma exacerbation and states it has been going on all day. She has been "back and forth to the hospitals all day" and states she was at [facility] earlier today.

## 2015-09-25 NOTE — ED Notes (Signed)
Pt stated that I did not tell her what her vitals were or the name of the nurse coming to see her. Also stated she was not going to be treated the way that she was treated earlier that day. Stated I was very rude and didn't know what I was doing and that she would be filing a complaint.

## 2015-09-25 NOTE — ED Notes (Signed)
Patient was asked could she be triaged. Patient yelled again saying that she is not stupid and is not going treated bad. Charged nurse was notified of situation. Other triage nurse went in room to try to triage patient. Patient is being very rude.

## 2015-09-26 ENCOUNTER — Emergency Department (HOSPITAL_COMMUNITY)
Admission: EM | Admit: 2015-09-26 | Discharge: 2015-09-26 | Disposition: A | Discharge status: Home / Self Care | Payer: No Typology Code available for payment source | Attending: Emergency Medicine | Admitting: Emergency Medicine

## 2015-09-26 DIAGNOSIS — J45901 Unspecified asthma with (acute) exacerbation: Secondary | ICD-10-CM

## 2015-09-26 DIAGNOSIS — R0603 Acute respiratory distress: Secondary | ICD-10-CM

## 2015-09-26 LAB — CBC WITH DIFFERENTIAL/PLATELET
BASOS ABS: 0 10*3/uL (ref 0.0–0.1)
BASOS PCT: 0 %
EOS ABS: 0.2 10*3/uL (ref 0.0–0.7)
Eosinophils Relative: 2 %
HCT: 24.3 % — ABNORMAL LOW (ref 36.0–46.0)
Hemoglobin: 7.3 g/dL — ABNORMAL LOW (ref 12.0–15.0)
LYMPHS PCT: 12 %
Lymphs Abs: 1.1 10*3/uL (ref 0.7–4.0)
MCH: 20.7 pg — AB (ref 26.0–34.0)
MCHC: 30 g/dL (ref 30.0–36.0)
MCV: 68.8 fL — ABNORMAL LOW (ref 78.0–100.0)
MONO ABS: 1 10*3/uL (ref 0.1–1.0)
Monocytes Relative: 10 %
Neutro Abs: 7.2 10*3/uL (ref 1.7–7.7)
Neutrophils Relative %: 76 %
PLATELETS: 407 10*3/uL — AB (ref 150–400)
RBC: 3.53 MIL/uL — AB (ref 3.87–5.11)
RDW: 18.7 % — ABNORMAL HIGH (ref 11.5–15.5)
WBC: 9.5 10*3/uL (ref 4.0–10.5)

## 2015-09-26 LAB — BASIC METABOLIC PANEL
Anion gap: 12 (ref 5–15)
BUN: 9 mg/dL (ref 6–20)
CO2: 25 mmol/L (ref 22–32)
Calcium: 8.9 mg/dL (ref 8.9–10.3)
Chloride: 102 mmol/L (ref 101–111)
Creatinine, Ser: 1.04 mg/dL — ABNORMAL HIGH (ref 0.44–1.00)
Glucose, Bld: 105 mg/dL — ABNORMAL HIGH (ref 65–99)
POTASSIUM: 3.7 mmol/L (ref 3.5–5.1)
SODIUM: 139 mmol/L (ref 135–145)

## 2015-09-26 MED ORDER — MAGNESIUM SULFATE 2 GM/50ML IV SOLN
2.0000 g | Freq: Once | INTRAVENOUS | Status: AC
  Administered 2015-09-26: 2 g via INTRAVENOUS
  Filled 2015-09-26: qty 50

## 2015-09-26 MED ORDER — ALBUTEROL (5 MG/ML) CONTINUOUS INHALATION SOLN
10.0000 mg/h | INHALATION_SOLUTION | RESPIRATORY_TRACT | Status: DC
  Administered 2015-09-26: 10 mg/h via RESPIRATORY_TRACT
  Filled 2015-09-26: qty 20

## 2015-09-26 MED ORDER — PREDNISONE 20 MG PO TABS
60.0000 mg | ORAL_TABLET | Freq: Once | ORAL | Status: AC
  Administered 2015-09-26: 60 mg via ORAL
  Filled 2015-09-26: qty 3

## 2015-09-26 MED ORDER — PREDNISONE 20 MG PO TABS
60.0000 mg | ORAL_TABLET | Freq: Every day | ORAL | Status: DC
Start: 2015-09-26 — End: 2015-12-31

## 2015-09-26 MED ORDER — ALBUTEROL SULFATE HFA 108 (90 BASE) MCG/ACT IN AERS
2.0000 | INHALATION_SPRAY | Freq: Once | RESPIRATORY_TRACT | Status: AC
  Administered 2015-09-26: 2 via RESPIRATORY_TRACT
  Filled 2015-09-26: qty 6.7

## 2015-09-26 MED ORDER — IPRATROPIUM BROMIDE 0.02 % IN SOLN
1.0000 mg | Freq: Once | RESPIRATORY_TRACT | Status: AC
  Administered 2015-09-26: 1 mg via RESPIRATORY_TRACT
  Filled 2015-09-26: qty 5

## 2015-09-26 NOTE — ED Provider Notes (Signed)
CSN: [id number]     Arrival date & time 09/25/15  2303 History   By signing my name below, I, Altamease Oiler, attest that this documentation has been prepared under the direction and in the presence of Everlene Balls, MD. Electronically Signed: Altamease Oiler, ED Scribe. 09/26/2015. 3:22 AM    Chief Complaint  Patient presents with  . Asthma    The history is provided by the patient. No language interpreter was used.   Heather Graham is a 40 y.o. female with history of asthma who presents to the Emergency Department complaining of productive cough with onset 1 week ago. Pt states that she was treated for pneumonia 2 weeks ago and finished z-pak yesterday but her symptoms are worse.  Associated symptoms include fever.She states that she is frequently exposed to sick people at her job at the [employer].    Past Medical History  Diagnosis Date  . Asthma    Past Surgical History  Procedure Laterality Date  . Tubal ligation     No family history on file. Social History  Substance Use Topics  . Smoking status: Never Smoker   . Smokeless tobacco: Never Used  . Alcohol Use: Yes   OB History    Gravida Para Term Preterm AB TAB SAB Ectopic Multiple Living   2 2        2      Review of Systems  10 Systems reviewed and all are negative for acute change except as noted in the HPI.  Allergies  Penicillins  Home Medications   Prior to Admission medications   Medication Sig Start Date End Date Taking? Authorizing Provider  albuterol (PROVENTIL HFA;VENTOLIN HFA) 108 (90 BASE) MCG/ACT inhaler Inhale 2 puffs into the lungs every 4 (four) hours as needed for wheezing or shortness of breath. 02/19/13  Yes Sharon Mt Street, MD  diphenhydrAMINE (BENADRYL) 25 MG tablet Take 50 mg by mouth at bedtime as needed for sleep.   Yes Historical Provider, MD  guaiFENesin (ROBITUSSIN) 100 MG/5ML SOLN Take 5 mLs by mouth every 4 (four) hours as needed for cough or to loosen phlegm.   Yes Historical  Provider, MD  promethazine-dextromethorphan (PROMETHAZINE-DM) 6.25-15 MG/5ML syrup Take 5 mLs by mouth 4 (four) times daily as needed for cough. 07/26/15  Yes Christopher Lawyer, PA-C   BP 139/80 mmHg  Pulse 104  Temp(Src) 101 F (38.3 C) (Oral)  Resp 20  SpO2 96%  LMP 09/18/2015 Physical Exam  Constitutional: She is oriented to person, place, and time. She appears well-developed and well-nourished. No distress.  HENT:  Head: Normocephalic and atraumatic.  Nose: Nose normal.  Mouth/Throat: Oropharynx is clear and moist. No oropharyngeal exudate.  Eyes: Conjunctivae and EOM are normal. Pupils are equal, round, and reactive to light. No scleral icterus.  Neck: Normal range of motion. Neck supple. No JVD present. No tracheal deviation present. No thyromegaly present.  Cardiovascular: Normal rate, regular rhythm and normal heart sounds.  Exam reveals no gallop and no friction rub.   No murmur heard. Pulmonary/Chest: Effort normal. No respiratory distress. She has wheezes. She exhibits no tenderness.  Abdominal: Soft. Bowel sounds are normal. She exhibits no distension and no mass. There is no tenderness. There is no rebound and no guarding.  Musculoskeletal: Normal range of motion. She exhibits no edema or tenderness.  Lymphadenopathy:    She has no cervical adenopathy.  Neurological: She is alert and oriented to person, place, and time. No cranial nerve deficit. She exhibits normal  muscle tone.  Skin: Skin is warm and dry. No rash noted. No erythema. No pallor.  Tactile fever  Nursing note and vitals reviewed.   ED Course  Procedures (including critical care time) DIAGNOSTIC STUDIES: Oxygen Saturation is 96% on RA,  normal by my interpretation.    COORDINATION OF CARE: 2:24 AM Discussed treatment plan which includes lab work, CXR, a breathing treatment, and prednisone with pt at bedside and pt agreed to plan. RT at the bedside.   Labs Review Labs Reviewed  CBC WITH  DIFFERENTIAL/PLATELET - Abnormal; Notable for the following:    RBC 3.53 (*)    Hemoglobin 7.3 (*)    HCT 24.3 (*)    MCV 68.8 (*)    MCH 20.7 (*)    RDW 18.7 (*)    Platelets 407 (*)    All other components within normal limits  BASIC METABOLIC PANEL - Abnormal; Notable for the following:    Glucose, Bld 105 (*)    Creatinine, Ser 1.04 (*)    All other components within normal limits    Imaging Review Dg Chest 2 View  09/25/2015  CLINICAL DATA:  Shortness of breath with fever and cough for 1 week EXAM: CHEST  2 VIEW COMPARISON:  September 25, 2014 FINDINGS: Lungs are clear. Heart size and pulmonary vascularity are normal. No adenopathy. No bone lesions. IMPRESSION: No edema or consolidation. Electronically Signed   By: Lowella Grip III M.D.   On: 09/25/2015 15:11   I have personally reviewed and evaluated these images and lab results as part of my medical decision-making.   EKG Interpretation None      MDM   Final diagnoses:  None    Patient presents to the emergency department for asthma exacerbation. She's been seen in the emergency department multiple times for this over the past 2 weeks. She states she recently was treated for pneumonia. She had an x-ray earlier today at 3 PM at [facility] which was negative for pneumonia. She is refusing repeat chest x-ray. She was given continuous albuterol treatment, steroids, magnesium, and Tylenol for fever. Patient was educated on risk of possible infection that could be diagnosed on chest x-ray today. She demonstrated good understanding, she is advised to follow-up with her primary care physician within 3 days for close management. Upon repeat evaluation, wheezing has improved but is still present. Patient is refusing admission. She was provided an albuterol inhaler. We'll discharge with prednisone to take for 4 days. She appears well in no acute distress, vital signs were within her normal limits and she is safe for  discharge.   CRITICAL CARE Performed by: Everlene Balls   Total critical care time: 35 minutes - resp distress  Critical care time was exclusive of separately billable procedures and treating other patients.  Critical care was necessary to treat or prevent imminent or life-threatening deterioration.  Critical care was time spent personally by me on the following activities: development of treatment plan with patient and/or surrogate as well as nursing, discussions with consultants, evaluation of patient's response to treatment, examination of patient, obtaining history from patient or surrogate, ordering and performing treatments and interventions, ordering and review of laboratory studies, ordering and review of radiographic studies, pulse oximetry and re-evaluation of patient's condition.  I personally performed the services described in this documentation, which was scribed in my presence. The recorded information has been reviewed and is accurate.      Everlene Balls, MD 09/26/15 [phone]

## 2015-09-26 NOTE — Discharge Instructions (Signed)
Asthma Attack Prevention Ms. Coster, use your albuterol inhaler every 6 hours for the next 2 days, then only use it as needed.  Take steroids daily.  See a primary care doctor within 3 days for close follow up. If symptoms worsen, come back to the ED Immediately. Thank you. While you may not be able to control the fact that you have asthma, you can take actions to prevent asthma attacks. The best way to prevent asthma attacks is to maintain good control of your asthma. You can achieve this by:  Taking your medicines as directed.  Avoiding things that can irritate your airways or make your asthma symptoms worse (asthma triggers).  Keeping track of how well your asthma is controlled and of any changes in your symptoms.  Responding quickly to worsening asthma symptoms (asthma attack).  Seeking emergency care when it is needed. WHAT ARE SOME WAYS TO PREVENT AN ASTHMA ATTACK? Have a Plan Work with your health care provider to create a written plan for managing and treating your asthma attacks (asthma action plan). This plan includes:  A list of your asthma triggers and how you can avoid them.  Information on when medicines should be taken and when their dosages should be changed.  The use of a device that measures how well your lungs are working (peak flow meter). Monitor Your Asthma Use your peak flow meter and record your results in a journal every day. A drop in your peak flow numbers on one or more days may indicate the start of an asthma attack. This can happen even before you start to feel symptoms. You can prevent an asthma attack from getting worse by following the steps in your asthma action plan. Avoid Asthma Triggers Work with your asthma health care provider to find out what your asthma triggers are. This can be done by:  Allergy testing.  Keeping a journal that notes when asthma attacks occur and the factors that may have contributed to them.  Determining if there are other  medical conditions that are making your asthma worse. Once you have determined your asthma triggers, take steps to avoid them. This may include avoiding excessive or prolonged exposure to:  Dust. Have someone dust and vacuum your home for you once or twice a week. Using a high-efficiency particulate arrestance (HEPA) vacuum is best.  Smoke. This includes campfire smoke, forest fire smoke, and secondhand smoke from tobacco products.  Pet dander. Avoid contact with animals that you know you are allergic to.  Allergens from trees, grasses or pollens. Avoid spending a lot of time outdoors when pollen counts are high, and on very windy days.  Very cold, dry, or humid air.  Mold.  Foods that contain high amounts of sulfites.  Strong odors.  Outdoor air pollutants, such as Lexicographer.  Indoor air pollutants, such as aerosol sprays and fumes from household cleaners.  Household pests, including dust mites and cockroaches, and pest droppings.  Certain medicines, including NSAIDs. Always talk to your health care provider before stopping or starting any new medicines. Medicines Take over-the-counter and prescription medicines only as told by your health care provider. Many asthma attacks can be prevented by carefully following your medicine schedule. Taking your medicines correctly is especially important when you cannot avoid certain asthma triggers. Act Quickly If an asthma attack does happen, acting quickly can decrease how severe it is and how long it lasts. Take these steps:   Pay attention to your symptoms. If you are coughing,  wheezing, or having difficulty breathing, do not wait to see if your symptoms go away on their own. Follow your asthma action plan.  If you have followed your asthma action plan and your symptoms are not improving, call your health care provider or seek immediate medical care at the nearest hospital. It is important to note how often you need to use your  fast-acting rescue inhaler. If you are using your rescue inhaler more often, it may mean that your asthma is not under control. Adjusting your asthma treatment plan may help you to prevent future asthma attacks and help you to gain better control of your condition. HOW CAN I PREVENT AN ASTHMA ATTACK WHEN I EXERCISE? Follow advice from your health care provider about whether you should use your fast-acting inhaler before exercising. Many people with asthma experience exercise-induced bronchoconstriction (EIB). This condition often worsens during vigorous exercise in cold, humid, or dry environments. Usually, people with EIB can stay very active by pre-treating with a fast-acting inhaler before exercising.   This information is not intended to replace advice given to you by your health care provider. Make sure you discuss any questions you have with your health care provider.   Document Released: 07/23/2009 Document Revised: 04/25/2015 Document Reviewed: 01/04/2015 Elsevier Interactive Patient Education [employer].

## 2015-12-31 ENCOUNTER — Inpatient Hospital Stay (HOSPITAL_COMMUNITY): Payer: Self-pay

## 2015-12-31 ENCOUNTER — Inpatient Hospital Stay (HOSPITAL_COMMUNITY)
Admission: AD | Admit: 2015-12-31 | Discharge: 2015-12-31 | Disposition: A | Discharge status: Home / Self Care | Payer: Self-pay | Source: Ambulatory Visit | Attending: Obstetrics & Gynecology | Admitting: Obstetrics & Gynecology

## 2015-12-31 ENCOUNTER — Encounter (HOSPITAL_COMMUNITY): Payer: Self-pay | Admitting: *Deleted

## 2015-12-31 DIAGNOSIS — N939 Abnormal uterine and vaginal bleeding, unspecified: Secondary | ICD-10-CM | POA: Insufficient documentation

## 2015-12-31 DIAGNOSIS — Z880 Allergy status to penicillin: Secondary | ICD-10-CM | POA: Insufficient documentation

## 2015-12-31 DIAGNOSIS — D259 Leiomyoma of uterus, unspecified: Secondary | ICD-10-CM

## 2015-12-31 DIAGNOSIS — D500 Iron deficiency anemia secondary to blood loss (chronic): Secondary | ICD-10-CM | POA: Insufficient documentation

## 2015-12-31 DIAGNOSIS — Z7952 Long term (current) use of systemic steroids: Secondary | ICD-10-CM | POA: Insufficient documentation

## 2015-12-31 DIAGNOSIS — D251 Intramural leiomyoma of uterus: Secondary | ICD-10-CM | POA: Insufficient documentation

## 2015-12-31 DIAGNOSIS — J45909 Unspecified asthma, uncomplicated: Secondary | ICD-10-CM | POA: Insufficient documentation

## 2015-12-31 LAB — URINE MICROSCOPIC-ADD ON

## 2015-12-31 LAB — CBC
HCT: 26.2 % — ABNORMAL LOW (ref 36.0–46.0)
HEMOGLOBIN: 7.7 g/dL — AB (ref 12.0–15.0)
MCH: 20.3 pg — ABNORMAL LOW (ref 26.0–34.0)
MCHC: 29.4 g/dL — ABNORMAL LOW (ref 30.0–36.0)
MCV: 69.1 fL — ABNORMAL LOW (ref 78.0–100.0)
Platelets: 459 10*3/uL — ABNORMAL HIGH (ref 150–400)
RBC: 3.79 MIL/uL — AB (ref 3.87–5.11)
RDW: 18.5 % — ABNORMAL HIGH (ref 11.5–15.5)
WBC: 6.1 10*3/uL (ref 4.0–10.5)

## 2015-12-31 LAB — WET PREP, GENITAL
Clue Cells Wet Prep HPF POC: NONE SEEN
SPERM: NONE SEEN
TRICH WET PREP: NONE SEEN
YEAST WET PREP: NONE SEEN

## 2015-12-31 LAB — URINALYSIS, ROUTINE W REFLEX MICROSCOPIC
BILIRUBIN URINE: NEGATIVE
Glucose, UA: NEGATIVE mg/dL
Ketones, ur: NEGATIVE mg/dL
Leukocytes, UA: NEGATIVE
NITRITE: NEGATIVE
PROTEIN: 30 mg/dL — AB
pH: 5.5 (ref 5.0–8.0)

## 2015-12-31 LAB — POCT PREGNANCY, URINE: Preg Test, Ur: NEGATIVE

## 2015-12-31 MED ORDER — TRAMADOL HCL 50 MG PO TABS: 50.0000 mg | ORAL_TABLET | Freq: Four times a day (QID) | ORAL | Status: DC | PRN

## 2015-12-31 MED ORDER — SODIUM CHLORIDE 0.9 % IV SOLN
510.0000 mg | Freq: Once | INTRAVENOUS | Status: AC
  Administered 2015-12-31: 510 mg via INTRAVENOUS
  Filled 2015-12-31: qty 17

## 2015-12-31 MED ORDER — FERROUS SULFATE 325 (65 FE) MG PO TABS: 325.0000 mg | ORAL_TABLET | Freq: Two times a day (BID) | ORAL | Status: DC

## 2015-12-31 MED ORDER — MEGESTROL ACETATE 40 MG PO TABS: 40.0000 mg | ORAL_TABLET | Freq: Three times a day (TID) | ORAL | Status: DC

## 2015-12-31 MED ORDER — KETOROLAC TROMETHAMINE 60 MG/2ML IM SOLN
60.0000 mg | Freq: Once | INTRAMUSCULAR | Status: AC
  Administered 2015-12-31: 60 mg via INTRAMUSCULAR
  Filled 2015-12-31: qty 2

## 2015-12-31 MED ORDER — MEGESTROL ACETATE 40 MG PO TABS
40.0000 mg | ORAL_TABLET | Freq: Every day | ORAL | Status: DC
  Administered 2015-12-31: 40 mg via ORAL
  Filled 2015-12-31: qty 1

## 2015-12-31 NOTE — MAU Provider Note (Signed)
History     CSN: [id number]  Arrival date and time: 12/31/15 0559   First Provider Initiated Contact with Patient 12/31/15 [phone]      Chief Complaint  Patient presents with  . Vaginal Bleeding   HPI Comments: Heather Graham is a 40 y.o. G2P2 who presents today with vaginal bleeding. She states that she has fibroids and chronic anemia (hgb normally 7-8). Patient is not taking iron like prescribed.  She was seen in the ED in [place] in 2015, and she was seen here in 2015 for heavy bleeding as well. She states that she did not follow up after any of those visits. She states that every month her periods are heavy, and the last few months they are getting heavier.  She was interested in surgery for the fibroids, however became nervous when the time came to make a decision.   Vaginal Bleeding The patient's primary symptoms include pelvic pain and vaginal bleeding. This is a new problem. The current episode started today. The problem occurs intermittently. The problem has been gradually worsening. The pain is severe (8/10 ). The problem affects both sides. She is not pregnant. Associated symptoms include abdominal pain and headaches. Pertinent negatives include no chills, constipation, diarrhea, dysuria, fever, frequency, nausea, urgency or vomiting. The vaginal discharge was bloody. The vaginal bleeding is heavier than menses. She has been passing clots. She has not been passing tissue. Nothing aggravates the symptoms. She has tried nothing for the symptoms. She is sexually active. She uses tubal ligation for contraception. Her menstrual history has been regular (LMP 12/01/15 ).    Past Medical History  Diagnosis Date  . Asthma     Past Surgical History  Procedure Laterality Date  . Tubal ligation      History reviewed. No pertinent family history.  Social History  Substance Use Topics  . Smoking status: Never Smoker   . Smokeless tobacco: Never Used  . Alcohol Use: Yes     Allergies:  Allergies  Allergen Reactions  . Penicillins Hives    Has patient had a PCN reaction causing immediate rash, facial/tongue/throat swelling, SOB or lightheadedness with hypotension: Yes Has patient had a PCN reaction causing severe rash involving mucus membranes or skin necrosis: Yes Has patient had a PCN reaction that required hospitalization No Has patient had a PCN reaction occurring within the last 10 years: No If all of the above answers are "NO", then may proceed with Cephalosporin use.     Prescriptions prior to admission  Medication Sig Dispense Refill Last Dose  . albuterol (PROVENTIL HFA;VENTOLIN HFA) 108 (90 BASE) MCG/ACT inhaler Inhale 2 puffs into the lungs every 4 (four) hours as needed for wheezing or shortness of breath. 1 Inhaler 1 unk  . diphenhydrAMINE (BENADRYL) 25 MG tablet Take 50 mg by mouth at bedtime as needed for sleep.   09/24/2015  . guaiFENesin (ROBITUSSIN) 100 MG/5ML SOLN Take 5 mLs by mouth every 4 (four) hours as needed for cough or to loosen phlegm.   Past Week at Unknown time  . predniSONE (DELTASONE) 20 MG tablet Take 3 tablets (60 mg total) by mouth daily. 12 tablet 0   . promethazine-dextromethorphan (PROMETHAZINE-DM) 6.25-15 MG/5ML syrup Take 5 mLs by mouth 4 (four) times daily as needed for cough. 120 mL 0 09/25/2015 at Unknown time   Results for orders placed or performed during the hospital encounter of 12/31/15 (from the past 48 hour(s))  CBC     Status: Abnormal   Collection  Time: 12/31/15  6:47 AM  Result Value Ref Range   WBC 6.1 4.0 - 10.5 K/uL   RBC 3.79 (L) 3.87 - 5.11 MIL/uL   Hemoglobin 7.7 (L) 12.0 - 15.0 g/dL   HCT 26.2 (L) 36.0 - 46.0 %   MCV 69.1 (L) 78.0 - 100.0 fL   MCH 20.3 (L) 26.0 - 34.0 pg   MCHC 29.4 (L) 30.0 - 36.0 g/dL   RDW 18.5 (H) 11.5 - 15.5 %   Platelets 459 (H) 150 - 400 K/uL  Wet prep, genital     Status: Abnormal   Collection Time: 12/31/15  7:03 AM  Result Value Ref Range   Yeast Wet Prep HPF POC  NONE SEEN NONE SEEN   Trich, Wet Prep NONE SEEN NONE SEEN   Clue Cells Wet Prep HPF POC NONE SEEN NONE SEEN   WBC, Wet Prep HPF POC FEW (A) NONE SEEN    Comment: MANY BACTERIA SEEN   Sperm NONE SEEN   Urinalysis, Routine w reflex microscopic (not at [facility])     Status: Abnormal   Collection Time: 12/31/15  7:05 AM  Result Value Ref Range   Color, Urine YELLOW YELLOW   APPearance CLOUDY (A) CLEAR   Specific Gravity, Urine >1.030 (H) 1.005 - 1.030   pH 5.5 5.0 - 8.0   Glucose, UA NEGATIVE NEGATIVE mg/dL   Hgb urine dipstick LARGE (A) NEGATIVE   Bilirubin Urine NEGATIVE NEGATIVE   Ketones, ur NEGATIVE NEGATIVE mg/dL   Protein, ur 30 (A) NEGATIVE mg/dL   Nitrite NEGATIVE NEGATIVE   Leukocytes, UA NEGATIVE NEGATIVE  Urine microscopic-add on     Status: Abnormal   Collection Time: 12/31/15  7:05 AM  Result Value Ref Range   Squamous Epithelial / LPF 0-5 (A) NONE SEEN   WBC, UA 0-5 0 - 5 WBC/hpf   RBC / HPF TOO NUMEROUS TO COUNT 0 - 5 RBC/hpf   Bacteria, UA MANY (A) NONE SEEN   Urine-Other YEAST PRESENT     Comment: MUCOUS PRESENT  Pregnancy, urine POC     Status: None   Collection Time: 12/31/15  7:19 AM  Result Value Ref Range   Preg Test, Ur NEGATIVE NEGATIVE    Comment:        THE SENSITIVITY OF THIS METHODOLOGY IS >24 mIU/mL    US Transvaginal Non-ob  12/31/2015  CLINICAL DATA:  Abnormal uterine bleeding, pelvic pain, anemia EXAM: TRANSABDOMINAL AND TRANSVAGINAL ULTRASOUND OF PELVIS TECHNIQUE: Both transabdominal and transvaginal ultrasound examinations of the pelvis were performed. Transabdominal technique was performed for global imaging of the pelvis including uterus, ovaries, adnexal regions, and pelvic cul-de-sac. It was necessary to proceed with endovaginal exam following the transabdominal exam to visualize the endometrium and right over. COMPARISON:  None FINDINGS: Uterus Measurements: 12.1 x 6.9 x 8.4 cm. Multiple uterine fibroids, including a dominant 5.2 x 4.8 x 5.4 cm  intramural fibroid in the left posterior uterine body. Additional 2.4 x 1.9 x 2.3 cm intramural fibroid in the right anterior uterine body and 2.0 x 1.3 x 1.7 cm subserosal fibroid in the right posterior uterine body. Endometrium Thickness: 12 mm.  No focal abnormality visualized. Right ovary Measurements: 3.1 x 1.4 x 3.0 cm. Normal appearance/no adnexal mass. Left ovary Measurements: 2.7 x 1.1 x 2.5 cm. Normal appearance/no adnexal mass. Other findings No abnormal free fluid. IMPRESSION: Uterine fibroids, including a dominant 5.2 cm intramural fibroid in the left posterior uterine body. Endometrial complex measures 12 mm, within normal limits.  Bilateral ovaries are within normal limits. Electronically Signed   By: Julian Hy M.D.   On: 12/31/2015 10:05   US Pelvis Complete  12/31/2015  CLINICAL DATA:  Abnormal uterine bleeding, pelvic pain, anemia EXAM: TRANSABDOMINAL AND TRANSVAGINAL ULTRASOUND OF PELVIS TECHNIQUE: Both transabdominal and transvaginal ultrasound examinations of the pelvis were performed. Transabdominal technique was performed for global imaging of the pelvis including uterus, ovaries, adnexal regions, and pelvic cul-de-sac. It was necessary to proceed with endovaginal exam following the transabdominal exam to visualize the endometrium and right over. COMPARISON:  None FINDINGS: Uterus Measurements: 12.1 x 6.9 x 8.4 cm. Multiple uterine fibroids, including a dominant 5.2 x 4.8 x 5.4 cm intramural fibroid in the left posterior uterine body. Additional 2.4 x 1.9 x 2.3 cm intramural fibroid in the right anterior uterine body and 2.0 x 1.3 x 1.7 cm subserosal fibroid in the right posterior uterine body. Endometrium Thickness: 12 mm.  No focal abnormality visualized. Right ovary Measurements: 3.1 x 1.4 x 3.0 cm. Normal appearance/no adnexal mass. Left ovary Measurements: 2.7 x 1.1 x 2.5 cm. Normal appearance/no adnexal mass. Other findings No abnormal free fluid. IMPRESSION: Uterine fibroids,  including a dominant 5.2 cm intramural fibroid in the left posterior uterine body. Endometrial complex measures 12 mm, within normal limits. Bilateral ovaries are within normal limits. Electronically Signed   By: Julian Hy M.D.   On: 12/31/2015 10:05    Review of Systems  Constitutional: Negative for fever and chills.  Gastrointestinal: Positive for abdominal pain. Negative for nausea, vomiting, diarrhea and constipation.  Genitourinary: Positive for vaginal bleeding and pelvic pain. Negative for dysuria, urgency and frequency.  Neurological: Positive for headaches. Negative for dizziness.   Physical Exam   Blood pressure 90/50, pulse 83, temperature 98.5 F (36.9 C), temperature source Oral, resp. rate 18, height 5\' 5"  (1.651 m), weight 137.893 kg (304 lb), last menstrual period 12/01/2015.  Physical Exam  Nursing note and vitals reviewed. Constitutional: She is oriented to person, place, and time. She appears well-developed and well-nourished. No distress.  HENT:  Head: Normocephalic.  Cardiovascular: Normal rate.   Respiratory: Effort normal.  GI: Soft. There is no tenderness. There is no rebound.  Genitourinary:   External: no lesion Vagina: moderate amount of BRB in the vagina.  Cervix: pink, smooth, no CMT Uterus: NSSC Adnexa: NT   Neurological: She is alert and oriented to person, place, and time.  Skin: Skin is warm and dry.  Psychiatric: She has a normal mood and affect.    MAU Course  Procedures  None  MDM [place] pending 0800 care turned over to J. Rasch, NP  Mathis Bud  12/31/2015   Feraheme 510 given IV Toradol 60 IM Megace 40 mg PO in MAU   Assessment and Plan   A:  1. Uterine leiomyoma, unspecified location   2. Abnormal uterine bleeding   3. Anemia due to chronic blood loss     P:  Discharge home in stable condition Rx: Megace TID       Iron BID- discussed the importance of taking this and how to take this.        Tramadol  for pain Keep your appointment in the clinic for 6/1 with Dr. Ihor Dow. Bleeding precautions Pelvic rest Return to MAU if symptoms worsen   Lezlie Lye, NP 12/31/2015 1:01 PM

## 2015-12-31 NOTE — Discharge Instructions (Signed)
Abnormal Uterine Bleeding Abnormal uterine bleeding means bleeding from the vagina that is not your normal menstrual period. This can be:  Bleeding or spotting between periods.  Bleeding after sex (sexual intercourse).  Bleeding that is heavier or more than normal.  Periods that last longer than usual.  Bleeding after menopause. There are many problems that may cause this. Treatment will depend on the cause of the bleeding. Any kind of bleeding that is not normal should be reviewed by your doctor.  HOME CARE Watch your condition for any changes. These actions may lessen any discomfort you are having:  Do not use tampons or douches as told by your doctor.  Change your pads often. You should get regular pelvic exams and Pap tests. Keep all appointments for tests as told by your doctor. GET HELP IF:  You are bleeding for more than 1 week.  You feel dizzy at times. GET HELP RIGHT AWAY IF:   You pass out.  You have to change pads every 15 to 30 minutes.  You have belly pain.  You have a fever.  You become sweaty or weak.  You are passing large blood clots from the vagina.  You feel sick to your stomach (nauseous) and throw up (vomit). MAKE SURE YOU:  Understand these instructions.  Will watch your condition.  Will get help right away if you are not doing well or get worse.   This information is not intended to replace advice given to you by your health care provider. Make sure you discuss any questions you have with your health care provider.   Document Released: 06/01/2009 Document Revised: 08/09/2013 Document Reviewed: 03/03/2013 Elsevier Interactive Patient Education 2016 [place]. Uterine Artery Embolization for Fibroids Uterine artery embolization is a nonsurgical treatment to shrink fibroids. A thin plastic tube (catheter) is used to inject material that blocks off the blood supply to the fibroid, which causes the fibroid to shrink. LET [facility] CARE  PROVIDER KNOW ABOUT:  Any allergies you have.  All medicines you are taking, including vitamins, herbs, eye drops, creams, and over-the-counter medicines.  Previous problems you or members of your family have had with the use of anesthetics.  Any blood disorders you have.  Previous surgeries you have had.  Medical conditions you have. RISKS AND COMPLICATIONS  Injury to the uterus from decreased blood supply  Infection.  Blood infection (septicemia).  Lack of menstrual periods (amenorrhea).  Death of tissue cells (necrosis) around your bladder or vulva.  Development of a hole between organs or from an organ to the surface of your skin (fistula).  Blood clot in the legs (deep vein thrombosis) or lung (pulmonary embolus). BEFORE THE PROCEDURE  Ask your health care provider about changing or stopping your regular medicines.   Do not take aspirin or blood thinners (anticoagulants) for 1 week before the surgery or as directed by your health care provider.  Do not eat or drink anything for 8 hours before the surgery or as directed by your health care provider.   Empty your bladder before the procedure begins. PROCEDURE   An IV tube will be placed into one of your veins. This will be used to give you a sedative and pain medication (conscious sedation).  You will be given a medicine that numbs the area (local anesthetic).  A small cut will be made in your groin. A catheter is then inserted into the main artery of your leg.  The catheter will be guided through the artery  to your uterus. A series of images will be taken while dye is injected through the catheter in your groin. X-rays are taken at the same time. This is done to provide a road map of the blood supply to your uterus and fibroids.  Tiny plastic spheres, about the size of sand grains, will be injected through the catheter. Metal coils may be used to help block the artery. The particles will lodge in tiny branches of  the uterine artery that supplies blood to the fibroids.  The procedure is repeated on the artery that supplies the other side of the uterus.  The catheter is then removed and pressure is held to stop any bleeding. No stitches are needed.  A dressing is then placed over the cut (incision). AFTER THE PROCEDURE  You will be taken to a recovery area where your progress will be monitored until you are awake, stable, and taking fluids well. If there are no other problems, you will then be moved to a regular hospital room.  You will be observed overnight in the hospital.  You will have cramping that should be controlled with pain medication.   This information is not intended to replace advice given to you by your health care provider. Make sure you discuss any questions you have with your health care provider.   Document Released: 10/20/2005 Document Revised: 05/25/2013 Document Reviewed: 02/17/2013 Elsevier Interactive Patient Education [employer].

## 2015-12-31 NOTE — MAU Note (Addendum)
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 Originally informed of the tumor 2 years ago at a clinic in [place]. Was offered a surgical intervention but refused at that time. Was seen in the [facility] last week and has an appt to return next week. Also c/o pelvic pain.

## 2016-01-01 LAB — GC/CHLAMYDIA PROBE AMP (~~LOC~~) NOT AT ARMC
Chlamydia: NEGATIVE
NEISSERIA GONORRHEA: NEGATIVE

## 2016-01-05 ENCOUNTER — Other Ambulatory Visit: Payer: Self-pay | Admitting: Obstetrics & Gynecology

## 2016-01-05 DIAGNOSIS — J4521 Mild intermittent asthma with (acute) exacerbation: Secondary | ICD-10-CM

## 2016-01-05 MED ORDER — ALBUTEROL SULFATE HFA 108 (90 BASE) MCG/ACT IN AERS: 2.0000 | INHALATION_SPRAY | RESPIRATORY_TRACT | Status: DC | PRN

## 2016-01-17 ENCOUNTER — Encounter: Payer: Self-pay | Admitting: Obstetrics & Gynecology

## 2016-01-17 ENCOUNTER — Ambulatory Visit (INDEPENDENT_AMBULATORY_CARE_PROVIDER_SITE_OTHER): Payer: Self-pay | Admitting: Obstetrics & Gynecology

## 2016-01-17 ENCOUNTER — Other Ambulatory Visit (HOSPITAL_COMMUNITY)
Admission: RE | Admit: 2016-01-17 | Discharge: 2016-01-17 | Disposition: A | Discharge status: Home / Self Care | Payer: Self-pay | Source: Ambulatory Visit | Attending: Obstetrics & Gynecology | Admitting: Obstetrics & Gynecology

## 2016-01-17 VITALS — BP 133/93 | HR 78 | Ht 68.0 in | Wt 288.0 lb

## 2016-01-17 DIAGNOSIS — Z124 Encounter for screening for malignant neoplasm of cervix: Secondary | ICD-10-CM

## 2016-01-17 DIAGNOSIS — N921 Excessive and frequent menstruation with irregular cycle: Secondary | ICD-10-CM

## 2016-01-17 DIAGNOSIS — D259 Leiomyoma of uterus, unspecified: Secondary | ICD-10-CM | POA: Insufficient documentation

## 2016-01-17 DIAGNOSIS — Z01419 Encounter for gynecological examination (general) (routine) without abnormal findings: Secondary | ICD-10-CM

## 2016-01-17 DIAGNOSIS — Z1151 Encounter for screening for human papillomavirus (HPV): Secondary | ICD-10-CM

## 2016-01-17 NOTE — Progress Notes (Signed)
Patient ID: Heather Graham, female   DOB: July 13, 1976, 39 y.o.   MRN: [id number] History:  40 y.o. G2P2 here today for AUB. Pt reports bleeding very heavy 14 days during the month with blood clots assoc. The bleeding is worse at night. Bleeding started in 2013.   The following portions of the patient's history were reviewed and updated as appropriate: allergies, current medications, past family history, past medical history, past social history, past surgical history and problem list.  Review of Systems:  Pertinent items are noted in HPI.  Objective:  Physical Exam Blood pressure 133/93, pulse 78, height 5\' 8"  (1.727 m), weight 288 lb (130.636 kg), last menstrual period 12/01/2015. Gen: NAD Lungs: CTA CV: RRR Abd: Soft, nontender and nondistended Pelvic: Normal appearing external genitalia; normal appearing vaginal mucosa and cervix.  Normal discharge.  Enlarged uterus with good decensus, no other palpable masses, no uterine or adnexal tenderness  The indications for endometrial biopsy were reviewed.   Risks of the biopsy including cramping, bleeding, infection, uterine perforation, inadequate specimen and need for additional procedures  were discussed. The patient states she understands and agrees to undergo procedure today. Consent was signed. Time out was performed. Urine HCG was negative. A sterile speculum was placed in the patient's vagina and the cervix was prepped with Betadine. A single-toothed tenaculum was placed on the anterior lip of the cervix to stabilize it. The 3 mm pipelle was introduced into the endometrial cavity without difficulty to a depth of 10cm, and a moderate amount of tissue was obtained and sent to pathology. The instruments were removed from the patient's vagina. Minimal bleeding from the cervix was noted. The patient tolerated the procedure well. Routine post-procedure instructions were given to the patient. The patient will follow up to review the results and for  further management.     Labs and Imaging  CBC    Component Value Date/Time   WBC 6.1 12/31/2015 0647   RBC 3.79* 12/31/2015 0647   HGB 7.7* 12/31/2015 0647   HCT 26.2* 12/31/2015 0647   PLT 459* 12/31/2015 0647   MCV 69.1* 12/31/2015 0647   MCH 20.3* 12/31/2015 0647   MCHC 29.4* 12/31/2015 0647   RDW 18.5* 12/31/2015 0647   LYMPHSABS 1.1 09/26/2015 0238   MONOABS 1.0 09/26/2015 0238   EOSABS 0.2 09/26/2015 0238   BASOSABS 0.0 09/26/2015 0238     US Transvaginal Non-ob  12/31/2015  CLINICAL DATA:  Abnormal uterine bleeding, pelvic pain, anemia EXAM: TRANSABDOMINAL AND TRANSVAGINAL ULTRASOUND OF PELVIS TECHNIQUE: Both transabdominal and transvaginal ultrasound examinations of the pelvis were performed. Transabdominal technique was performed for global imaging of the pelvis including uterus, ovaries, adnexal regions, and pelvic cul-de-sac. It was necessary to proceed with endovaginal exam following the transabdominal exam to visualize the endometrium and right over. COMPARISON:  None FINDINGS: Uterus Measurements: 12.1 x 6.9 x 8.4 cm. Multiple uterine fibroids, including a dominant 5.2 x 4.8 x 5.4 cm intramural fibroid in the left posterior uterine body. Additional 2.4 x 1.9 x 2.3 cm intramural fibroid in the right anterior uterine body and 2.0 x 1.3 x 1.7 cm subserosal fibroid in the right posterior uterine body. Endometrium Thickness: 12 mm.  No focal abnormality visualized. Right ovary Measurements: 3.1 x 1.4 x 3.0 cm. Normal appearance/no adnexal mass. Left ovary Measurements: 2.7 x 1.1 x 2.5 cm. Normal appearance/no adnexal mass. Other findings No abnormal free fluid. IMPRESSION: Uterine fibroids, including a dominant 5.2 cm intramural fibroid in the left posterior uterine body. Endometrial  complex measures 12 mm, within normal limits. Bilateral ovaries are within normal limits. Electronically Signed   By: Julian Hy M.D.   On: 12/31/2015 10:05   US Pelvis Complete  12/31/2015   CLINICAL DATA:  Abnormal uterine bleeding, pelvic pain, anemia EXAM: TRANSABDOMINAL AND TRANSVAGINAL ULTRASOUND OF PELVIS TECHNIQUE: Both transabdominal and transvaginal ultrasound examinations of the pelvis were performed. Transabdominal technique was performed for global imaging of the pelvis including uterus, ovaries, adnexal regions, and pelvic cul-de-sac. It was necessary to proceed with endovaginal exam following the transabdominal exam to visualize the endometrium and right over. COMPARISON:  None FINDINGS: Uterus Measurements: 12.1 x 6.9 x 8.4 cm. Multiple uterine fibroids, including a dominant 5.2 x 4.8 x 5.4 cm intramural fibroid in the left posterior uterine body. Additional 2.4 x 1.9 x 2.3 cm intramural fibroid in the right anterior uterine body and 2.0 x 1.3 x 1.7 cm subserosal fibroid in the right posterior uterine body. Endometrium Thickness: 12 mm.  No focal abnormality visualized. Right ovary Measurements: 3.1 x 1.4 x 3.0 cm. Normal appearance/no adnexal mass. Left ovary Measurements: 2.7 x 1.1 x 2.5 cm. Normal appearance/no adnexal mass. Other findings No abnormal free fluid. IMPRESSION: Uterine fibroids, including a dominant 5.2 cm intramural fibroid in the left posterior uterine body. Endometrial complex measures 12 mm, within normal limits. Bilateral ovaries are within normal limits. Electronically Signed   By: Julian Hy M.D.   On: 12/31/2015 10:05    Assessment & Plan:  AUB-  Thought due to uterine fibroids  Megace decrease to 40 bid F/u PAP F/u surg path F/u in 4 weeks or sooner prn  Brittni Hult L. Harraway-Smith, M.D., Cherlynn June

## 2016-01-21 LAB — CYTOLOGY - PAP

## 2016-01-21 LAB — CERVICOVAGINAL ANCILLARY ONLY: Candida vaginitis: NEGATIVE

## 2016-01-22 ENCOUNTER — Encounter: Payer: Self-pay | Admitting: Obstetrics & Gynecology

## 2016-01-23 ENCOUNTER — Telehealth: Payer: Self-pay | Admitting: General Practice

## 2016-01-23 DIAGNOSIS — N760 Acute vaginitis: Principal | ICD-10-CM

## 2016-01-23 DIAGNOSIS — B9689 Other specified bacterial agents as the cause of diseases classified elsewhere: Secondary | ICD-10-CM

## 2016-01-23 MED ORDER — METRONIDAZOLE 500 MG PO TABS: 500.0000 mg | ORAL_TABLET | Freq: Two times a day (BID) | ORAL | Status: DC

## 2016-01-23 NOTE — Telephone Encounter (Signed)
Patient's wet prep shows BV. Attempted to call patient but the message on the voicemail states a different name & number. Will address at patient's visit.

## 2016-01-29 ENCOUNTER — Encounter: Payer: Self-pay | Admitting: *Deleted

## 2016-01-30 ENCOUNTER — Other Ambulatory Visit: Payer: Self-pay | Admitting: Obstetrics & Gynecology

## 2016-01-30 ENCOUNTER — Encounter: Payer: Self-pay | Admitting: *Deleted

## 2016-01-30 DIAGNOSIS — Z1231 Encounter for screening mammogram for malignant neoplasm of breast: Secondary | ICD-10-CM

## 2016-02-04 ENCOUNTER — Other Ambulatory Visit: Payer: Self-pay | Admitting: Obstetrics & Gynecology

## 2016-02-04 ENCOUNTER — Telehealth: Payer: Self-pay | Admitting: Obstetrics & Gynecology

## 2016-02-04 DIAGNOSIS — R590 Localized enlarged lymph nodes: Secondary | ICD-10-CM

## 2016-02-04 NOTE — Telephone Encounter (Signed)
TC to pt after review of [facility] pelvic CT.  There was lymphadenopathy along the left ext iliac chain of undetermined etiology.  Pt reports that this was NOT followed up as she moved after the study was obtained and did not get the f/u as recommended.    Have advised pt to get a repeat CT to check for resolution or progression.  Pt agrees to have testing done.  I have advised her to also complete the financial aid paperwork which she reports that she will pick up from our ofc at [facility] hosp.  CT with and without contrast to be ordered.  Paulette Rockford L. Harraway-Smith, M.D., Cherlynn June

## 2016-02-14 ENCOUNTER — Ambulatory Visit: Payer: Self-pay | Admitting: Obstetrics & Gynecology

## 2016-02-21 ENCOUNTER — Telehealth: Payer: Self-pay | Admitting: *Deleted

## 2016-02-21 NOTE — Telephone Encounter (Signed)
Pt left message yesterday stating that she needs a letter stating that she had a test.  Please call back.

## 2016-02-22 ENCOUNTER — Encounter: Payer: Self-pay | Admitting: *Deleted

## 2016-02-26 NOTE — Progress Notes (Signed)
Pt advised of CT abdomen/Pelvis scheduled on 7/18 @ 0930.  She will need to pick up her contrast prior to the appt. She also needs to obtain application for financial assistance [facility]) from our office and complete ASAP.  Pt voiced understanding of all information and instructions given.

## 2016-02-27 ENCOUNTER — Other Ambulatory Visit: Payer: Self-pay | Admitting: General Practice

## 2016-02-27 NOTE — Telephone Encounter (Signed)
Patient called and left message requesting refill of albuterol inhaler. Patient needs referral to PCP or Family Medicine. Called patient, no answer- left message stating we are trying to reach you to return your phone call, please call us back.

## 2016-02-28 NOTE — Telephone Encounter (Signed)
Patient called into front office and was informed that she needed to find a PCP or family dr for future refills & information of offices was provided.

## 2016-03-03 ENCOUNTER — Other Ambulatory Visit: Payer: Self-pay | Admitting: General Practice

## 2016-03-03 DIAGNOSIS — N939 Abnormal uterine and vaginal bleeding, unspecified: Secondary | ICD-10-CM

## 2016-03-04 ENCOUNTER — Ambulatory Visit (HOSPITAL_COMMUNITY): Admission: RE | Admit: 2016-03-04 | Payer: Self-pay | Source: Ambulatory Visit

## 2016-03-19 ENCOUNTER — Ambulatory Visit: Payer: Self-pay | Admitting: Obstetrics & Gynecology

## 2016-04-10 ENCOUNTER — Ambulatory Visit (HOSPITAL_COMMUNITY): Payer: Self-pay

## 2016-05-16 ENCOUNTER — Ambulatory Visit (HOSPITAL_COMMUNITY): Payer: Self-pay

## 2016-05-19 ENCOUNTER — Emergency Department (HOSPITAL_COMMUNITY)
Admission: EM | Admit: 2016-05-19 | Discharge: 2016-05-19 | Disposition: A | Discharge status: Home / Self Care | Payer: Self-pay | Attending: Emergency Medicine | Admitting: Emergency Medicine

## 2016-05-19 ENCOUNTER — Encounter (HOSPITAL_COMMUNITY): Payer: Self-pay | Admitting: Emergency Medicine

## 2016-05-19 DIAGNOSIS — Y999 Unspecified external cause status: Secondary | ICD-10-CM | POA: Insufficient documentation

## 2016-05-19 DIAGNOSIS — X58XXXA Exposure to other specified factors, initial encounter: Secondary | ICD-10-CM | POA: Insufficient documentation

## 2016-05-19 DIAGNOSIS — J45909 Unspecified asthma, uncomplicated: Secondary | ICD-10-CM | POA: Insufficient documentation

## 2016-05-19 DIAGNOSIS — S0502XA Injury of conjunctiva and corneal abrasion without foreign body, left eye, initial encounter: Secondary | ICD-10-CM | POA: Insufficient documentation

## 2016-05-19 DIAGNOSIS — Y929 Unspecified place or not applicable: Secondary | ICD-10-CM | POA: Insufficient documentation

## 2016-05-19 DIAGNOSIS — Y939 Activity, unspecified: Secondary | ICD-10-CM | POA: Insufficient documentation

## 2016-05-19 MED ORDER — FLUORESCEIN SODIUM 1 MG OP STRP
1.0000 | ORAL_STRIP | Freq: Once | OPHTHALMIC | Status: AC
  Administered 2016-05-19: 1 via OPHTHALMIC
  Filled 2016-05-19: qty 1

## 2016-05-19 MED ORDER — TETRACAINE HCL 0.5 % OP SOLN
1.0000 [drp] | Freq: Once | OPHTHALMIC | Status: AC
  Administered 2016-05-19: 1 [drp] via OPHTHALMIC
  Filled 2016-05-19: qty 2

## 2016-05-19 MED ORDER — ERYTHROMYCIN 5 MG/GM OP OINT: TOPICAL_OINTMENT | OPHTHALMIC | 0 refills | Status: DC

## 2016-05-19 NOTE — ED Provider Notes (Signed)
[facility] DEPT Provider Note   CSN: [id number] Arrival date & time: 05/19/16  1908     History   Chief Complaint Chief Complaint  Patient presents with  . Eye Injury    HPI Heather Graham is a 40 y.o. female.  Patient is 40 yo F who presents to ED after accidentally scratching her left eye with her fingernail at 4:30 this afternoon. She states she has felt constant dull pain in her left eye since, but no change in vision. Patient denies using saline drops or water to irrigate her eyes. Denies sensation of foreign body in her eye.      Past Medical History:  Diagnosis Date  . Asthma     There are no active problems to display for this patient.   Past Surgical History:  Procedure Laterality Date  . TUBAL LIGATION      OB History    Gravida Para Term Preterm AB Living   2 2       2    SAB TAB Ectopic Multiple Live Births                   Home Medications    Prior to Admission medications   Medication Sig Start Date End Date Taking? Authorizing Provider  albuterol (PROVENTIL HFA;VENTOLIN HFA) 108 (90 Base) MCG/ACT inhaler Inhale 2 puffs into the lungs every 4 (four) hours as needed for wheezing or shortness of breath. 01/05/16   Osborne Oman, MD  diphenhydrAMINE (BENADRYL) 25 MG tablet Take 50 mg by mouth at bedtime as needed for sleep.    Historical Provider, MD  ferrous sulfate 325 (65 FE) MG tablet Take 1 tablet (325 mg total) by mouth 2 (two) times daily with a meal. 12/31/15   Lezlie Lye, NP  megestrol (MEGACE) 40 MG tablet Take 1 tablet (40 mg total) by mouth 3 (three) times daily. 12/31/15   Lezlie Lye, NP  metroNIDAZOLE (FLAGYL) 500 MG tablet Take 1 tablet (500 mg total) by mouth 2 (two) times daily. 01/23/16   Lavonia Drafts, MD  traMADol (ULTRAM) 50 MG tablet Take 1 tablet (50 mg total) by mouth every 6 (six) hours as needed. 12/31/15   Lezlie Lye, NP    Family History No family history on file.  Social History Social  History  Substance Use Topics  . Smoking status: Never Smoker  . Smokeless tobacco: Never Used  . Alcohol use Yes     Allergies   Penicillins   Review of Systems Review of Systems  Eyes: Positive for pain and redness. Negative for visual disturbance.  Neurological: Negative for dizziness and headaches.     Physical Exam Updated Vital Signs BP 155/94 (BP Location: Right Arm)   Pulse 87   Temp 98.6 F (37 C) (Oral)   Ht 5\' 5"  (1.651 m)   Wt 133.8 kg   LMP 05/05/2016 (Approximate)   SpO2 99%   BMI 49.09 kg/m   Physical Exam  Constitutional: She appears well-developed and well-nourished. No distress.  HENT:  Head: Normocephalic and atraumatic.  Mouth/Throat: Oropharynx is clear and moist.  Eyes: EOM and lids are normal. Pupils are equal, round, and reactive to light. Lids are everted and swept, no foreign bodies found. No foreign body present in the left eye. Left conjunctiva has a hemorrhage.  Slit lamp exam:      The left eye shows corneal abrasion and fluorescein uptake.  Neck: Normal range of motion.  Cardiovascular: Normal rate.  Pulmonary/Chest: Effort normal. No respiratory distress.  Neurological: She is alert.  Skin: Skin is warm and dry.  Nursing note and vitals reviewed.    ED Treatments / Results  Labs (all labs ordered are listed, but only abnormal results are displayed) Labs Reviewed - No data to display  EKG  EKG Interpretation None       Radiology No results found.  Procedures Procedures (including critical care time)  Medications Ordered in ED Medications  tetracaine (PONTOCAINE) 0.5 % ophthalmic solution 1 drop (1 drop Left Eye Given 05/19/16 2100)  fluorescein ophthalmic strip 1 strip (1 strip Left Eye Given 05/19/16 2100)     Initial Impression / Assessment and Plan / ED Course  I have reviewed the triage vital signs and the nursing notes.  Pertinent labs & imaging results that were available during my care of the patient  were reviewed by me and considered in my medical decision making (see chart for details).  Clinical Course   Patient presents after scratching her left eye with her fingernail. No visual deficits noted. Eye examined under slit lamp with fluorescein stain, and revealed left corneal abrasion. Provided erythromycin ointment, instructions to keep eye well lubricated with visine eye drops, and information to follow up with ophthalmology. Advised to return to ED for new or worsening symptoms including eye pain, discharge, or visual changes.  Final Clinical Impressions(s) / ED Diagnoses   Final diagnoses:  Abrasion of left cornea, initial encounter    New Prescriptions New Prescriptions   ERYTHROMYCIN OPHTHALMIC OINTMENT    Place a 1/2 inch ribbon of ointment into the lower eyelid.     [place], [place] 05/19/16 2132    Pattricia Boss, MD 05/22/16 [phone]

## 2016-05-19 NOTE — ED Triage Notes (Signed)
Pt. reports injury to left eye this evening , she accidentally hit it with her fingernail , presents with red spot at left sclera . No vision loss . Pt. also requesting prescription for asthma inhaler .

## 2016-05-20 ENCOUNTER — Telehealth: Payer: Self-pay | Admitting: *Deleted

## 2016-05-20 NOTE — Telephone Encounter (Signed)
Pt left message yesterday stating that she needs to reschedule an appointment @ [facility]. Please call back.

## 2016-05-22 NOTE — Telephone Encounter (Signed)
I called Drisana back and informed her she can call to reschedule the CT scan of her abd/pelvis since we scheduled it initially. I gave her the number. She wanted to know when she will have surgery. Per review of her chart I informed her she needs to turn in the finacial assistance paperwork asap.  That takes up to 45 days for approval - so turn it in asap. I also informed her depending on results of ct results will determine plan of care/ or surgery. I also notified her she will need to be seen again before surgery is scheduled- Dr. Ihor Dow will make plan of care after she reviews the ct scan results. She voices understanding.

## 2016-05-24 ENCOUNTER — Inpatient Hospital Stay (HOSPITAL_COMMUNITY)
Admission: AD | Admit: 2016-05-24 | Discharge: 2016-05-25 | Disposition: A | Discharge status: Home / Self Care | Payer: Self-pay | Source: Ambulatory Visit | Attending: Obstetrics and Gynecology | Admitting: Obstetrics and Gynecology

## 2016-05-24 DIAGNOSIS — M5440 Lumbago with sciatica, unspecified side: Secondary | ICD-10-CM | POA: Insufficient documentation

## 2016-05-24 DIAGNOSIS — M545 Low back pain: Secondary | ICD-10-CM

## 2016-05-24 DIAGNOSIS — Z880 Allergy status to penicillin: Secondary | ICD-10-CM | POA: Insufficient documentation

## 2016-05-25 DIAGNOSIS — M545 Low back pain: Secondary | ICD-10-CM

## 2016-05-25 NOTE — Discharge Instructions (Signed)

## 2016-05-25 NOTE — MAU Provider Note (Signed)
Chief Complaint: Back Pain and Leg Swelling   First Provider Initiated Contact with Patient 05/25/16 0108     SUBJECTIVE HPI: Heather Graham is a 40 y.o. G2P2 female who presents to Maternity Admissions reporting "her back has gone out". No Gyn complaints.   Location: low back Quality: sharp Severity: 9/10 on pain scale Duration: <24 hours   Past Medical History:  Diagnosis Date  . Asthma    OB History  Gravida Para Term Preterm AB Living  2 2       2   SAB TAB Ectopic Multiple Live Births               # Outcome Date GA Lbr Len/2nd Weight Sex Delivery Anes PTL Lv  2 Para           1 Para              Past Surgical History:  Procedure Laterality Date  . TUBAL LIGATION     Social History   Social History  . Marital status: Divorced    Spouse name: N/A  . Number of children: N/A  . Years of education: N/A   Occupational History  . Not on file.   Social History Main Topics  . Smoking status: Never Smoker  . Smokeless tobacco: Never Used  . Alcohol use Yes  . Drug use: No  . Sexual activity: Yes    Birth control/ protection: Surgical   Other Topics Concern  . Not on file   Social History Narrative  . No narrative on file   No current facility-administered medications on file prior to encounter.    Current Outpatient Prescriptions on File Prior to Encounter  Medication Sig Dispense Refill  . albuterol (PROVENTIL HFA;VENTOLIN HFA) 108 (90 Base) MCG/ACT inhaler Inhale 2 puffs into the lungs every 4 (four) hours as needed for wheezing or shortness of breath. 1 Inhaler 5  . diphenhydrAMINE (BENADRYL) 25 MG tablet Take 50 mg by mouth at bedtime as needed for sleep.    Marland Kitchen erythromycin ophthalmic ointment Place a 1/2 inch ribbon of ointment into the lower eyelid. 1 g 0  . ferrous sulfate 325 (65 FE) MG tablet Take 1 tablet (325 mg total) by mouth 2 (two) times daily with a meal. 60 tablet 1  . megestrol (MEGACE) 40 MG tablet Take 1 tablet (40 mg total) by mouth 3  (three) times daily. 120 tablet 0  . metroNIDAZOLE (FLAGYL) 500 MG tablet Take 1 tablet (500 mg total) by mouth 2 (two) times daily. 14 tablet 0  . traMADol (ULTRAM) 50 MG tablet Take 1 tablet (50 mg total) by mouth every 6 (six) hours as needed. 15 tablet 0   Allergies  Allergen Reactions  . Penicillins Hives    Has patient had a PCN reaction causing immediate rash, facial/tongue/throat swelling, SOB or lightheadedness with hypotension: Yes Has patient had a PCN reaction causing severe rash involving mucus membranes or skin necrosis: Yes Has patient had a PCN reaction that required hospitalization No Has patient had a PCN reaction occurring within the last 10 years: No If all of the above answers are "NO", then may proceed with Cephalosporin use.     I have reviewed the past Medical Hx, Surgical Hx, Social Hx, Allergies and Medications.   Review of Systems  Unable to perform ROS: Other (pt walking out of room)    OBJECTIVE Patient Vitals for the past 24 hrs:  BP Temp Pulse Resp Height Weight  05/25/16  0006 149/86 - 85 - - -  05/25/16 0003 - 98.4 F (36.9 C) - 18 5\' 5"  (1.651 m) (!) 309 lb 9.6 oz (140.4 kg)   Constitutional: Well-developed, well-nourished, Morbidly obese female in mild distress.  Cardiovascular: normal rate Respiratory: normal rate and effort.  MS: Uncomfortable-appearing with ambulation Neurologic: Alert and oriented x 4.   LAB RESULTS No results found for this or any previous visit (from the past 24 hour(s)).  IMAGING No results found.  MAU COURSE Offered patient Flexeril or ibuprofen for her pain. States she does not like to take medicine. Offered patient warm or cold compresses, declined. Suggested follow-up with primary care provider. Patient states he does not have a primary care provider and doesn't have insurance. Recommended [facility] since they do sliding scale. Patient states she's been  referred to them before, but cannot afford it and walked out of the room without discharge papers.  MDM - Low back pain of unknown etiology. Able to ambulate.  ASSESSMENT 1. Low back pain, unspecified back pain laterality, unspecified chronicity, with sciatica presence unspecified     PLAN Discharge home in stable condition. Unable to give discharge precautions or handout due to patient walking out of the room.    Medication List    STOP taking these medications   metroNIDAZOLE 500 MG tablet Commonly known as:  FLAGYL     TAKE these medications   albuterol 108 (90 Base) MCG/ACT inhaler Commonly known as:  PROVENTIL HFA;VENTOLIN HFA Inhale 2 puffs into the lungs every 4 (four) hours as needed for wheezing or shortness of breath.   diphenhydrAMINE 25 MG tablet Commonly known as:  BENADRYL Take 50 mg by mouth at bedtime as needed for sleep.   erythromycin ophthalmic ointment Place a 1/2 inch ribbon of ointment into the lower eyelid.   ferrous sulfate 325 (65 FE) MG tablet Take 1 tablet (325 mg total) by mouth 2 (two) times daily with a meal.   megestrol 40 MG tablet Commonly known as:  MEGACE Take 1 tablet (40 mg total) by mouth 3 (three) times daily.   traMADol 50 MG tablet Commonly known as:  ULTRAM Take 1 tablet (50 mg total) by mouth every 6 (six) hours as needed.        [place], [place] 05/25/2016  1:09 AM

## 2016-05-25 NOTE — Progress Notes (Signed)
Heather Graham went to rm to evaluate pt. See provider notes for additional information. Pt walked out before DC

## 2016-05-25 NOTE — MAU Note (Addendum)
Lower back pain. THis is third time my back has gone out. Having pain and makes it hard to walk. Was standing in store and pain started. Hurts getting up and down. Feet are swollen. Occ pelvic pain. Hx fibroids

## 2016-05-28 ENCOUNTER — Other Ambulatory Visit: Payer: Self-pay | Admitting: Obstetrics & Gynecology

## 2016-05-28 ENCOUNTER — Encounter (HOSPITAL_COMMUNITY): Payer: Self-pay

## 2016-05-28 ENCOUNTER — Ambulatory Visit (HOSPITAL_COMMUNITY)
Admission: RE | Admit: 2016-05-28 | Discharge: 2016-05-28 | Disposition: A | Discharge status: Home / Self Care | Payer: Self-pay | Source: Ambulatory Visit | Attending: Obstetrics & Gynecology | Admitting: Obstetrics & Gynecology

## 2016-05-28 DIAGNOSIS — K439 Ventral hernia without obstruction or gangrene: Secondary | ICD-10-CM | POA: Insufficient documentation

## 2016-05-28 DIAGNOSIS — D259 Leiomyoma of uterus, unspecified: Secondary | ICD-10-CM | POA: Insufficient documentation

## 2016-05-28 DIAGNOSIS — R590 Localized enlarged lymph nodes: Secondary | ICD-10-CM | POA: Insufficient documentation

## 2016-05-28 MED ORDER — IOPAMIDOL (ISOVUE-300) INJECTION 61%
100.0000 mL | Freq: Once | INTRAVENOUS | Status: AC | PRN
  Administered 2016-05-28: 100 mL via INTRAVENOUS

## 2016-05-28 MED ORDER — IOPAMIDOL (ISOVUE-300) INJECTION 61%
30.0000 mL | Freq: Once | INTRAVENOUS | Status: DC | PRN
  Administered 2016-05-28: 30 mL via ORAL
  Filled 2016-05-28: qty 30

## 2016-07-08 ENCOUNTER — Encounter (HOSPITAL_COMMUNITY): Payer: Self-pay

## 2016-07-27 ENCOUNTER — Emergency Department (HOSPITAL_COMMUNITY): Admission: EM | Admit: 2016-07-27 | Discharge: 2016-07-27 | Discharge status: Against Medical Advice | Payer: Self-pay

## 2016-07-27 ENCOUNTER — Encounter (HOSPITAL_COMMUNITY): Payer: Self-pay | Admitting: Emergency Medicine

## 2016-07-27 ENCOUNTER — Emergency Department (HOSPITAL_COMMUNITY)
Admission: EM | Admit: 2016-07-27 | Discharge: 2016-07-27 | Disposition: A | Discharge status: Home / Self Care | Payer: Self-pay | Attending: Emergency Medicine | Admitting: Emergency Medicine

## 2016-07-27 DIAGNOSIS — J40 Bronchitis, not specified as acute or chronic: Secondary | ICD-10-CM | POA: Insufficient documentation

## 2016-07-27 DIAGNOSIS — J4520 Mild intermittent asthma, uncomplicated: Secondary | ICD-10-CM | POA: Insufficient documentation

## 2016-07-27 MED ORDER — AZITHROMYCIN 250 MG PO TABS: 250.0000 mg | ORAL_TABLET | Freq: Every day | ORAL | 0 refills | Status: DC

## 2016-07-27 MED ORDER — ALBUTEROL SULFATE HFA 108 (90 BASE) MCG/ACT IN AERS
2.0000 | INHALATION_SPRAY | Freq: Once | RESPIRATORY_TRACT | Status: AC
  Administered 2016-07-27: 2 via RESPIRATORY_TRACT
  Filled 2016-07-27: qty 6.7

## 2016-07-27 MED ORDER — ALBUTEROL SULFATE (2.5 MG/3ML) 0.083% IN NEBU
5.0000 mg | INHALATION_SOLUTION | Freq: Once | RESPIRATORY_TRACT | Status: AC
  Administered 2016-07-27: 5 mg via RESPIRATORY_TRACT
  Filled 2016-07-27: qty 6

## 2016-07-27 MED ORDER — HYDROCODONE-HOMATROPINE 5-1.5 MG/5ML PO SYRP: 5.0000 mL | ORAL_SOLUTION | Freq: Four times a day (QID) | ORAL | 0 refills | Status: DC | PRN

## 2016-07-27 MED ORDER — DEXAMETHASONE SODIUM PHOSPHATE 10 MG/ML IJ SOLN
10.0000 mg | Freq: Once | INTRAMUSCULAR | Status: AC
  Administered 2016-07-27: 10 mg via INTRAMUSCULAR
  Filled 2016-07-27: qty 1

## 2016-07-27 MED ORDER — IPRATROPIUM BROMIDE 0.02 % IN SOLN
0.5000 mg | Freq: Once | RESPIRATORY_TRACT | Status: AC
  Administered 2016-07-27: 0.5 mg via RESPIRATORY_TRACT
  Filled 2016-07-27: qty 2.5

## 2016-07-27 NOTE — ED Triage Notes (Signed)
Pt c/o itchy throat and flare up of her asthma with wheezing. Pt also c/o cough.

## 2016-07-27 NOTE — ED Notes (Signed)
Pt requesting ice, OK per Dr Audie Pinto pt given the same

## 2016-07-27 NOTE — ED Provider Notes (Signed)
[facility] DEPT Provider Note   CSN: [id number] Arrival date & time: 07/27/16  1846  By signing my name below, I, Heather Graham, attest that this documentation has been prepared under the direction and in the presence of Heather Schwartz, MD. Electronically Signed: Hansel Graham, ED Scribe. 07/27/16. 6:59 PM.     History   Chief Complaint Chief Complaint  Patient presents with  . Cough  . Asthma    HPI Heather Graham is a 40 y.o. female with h/o asthma who presents to the Emergency Department complaining of worsening wheezing that began this week with associated itchy throat and productive cough with green phlegm. Pt states her cough is worsened at night and when lying flat. No alleviating factors noted. Pt states her current symptoms are similar to prior asthma exacerbations. She denies additional symptoms.   The history is provided by the patient. No language interpreter was used.    Past Medical History:  Diagnosis Date  . Asthma     There are no active problems to display for this patient.   Past Surgical History:  Procedure Laterality Date  . TUBAL LIGATION      OB History    Gravida Para Term Preterm AB Living   2 2       2    SAB TAB Ectopic Multiple Live Births                   Home Medications    Prior to Admission medications   Medication Sig Start Date End Date Taking? Authorizing Provider  albuterol (PROVENTIL HFA;VENTOLIN HFA) 108 (90 Base) MCG/ACT inhaler Inhale 2 puffs into the lungs every 4 (four) hours as needed for wheezing or shortness of breath. 01/05/16   Osborne Oman, MD  azithromycin (ZITHROMAX) 250 MG tablet Take 1 tablet (250 mg total) by mouth daily. Take first 2 tablets together, then 1 every day until finished. 07/27/16   Heather Schwartz, MD  diphenhydrAMINE (BENADRYL) 25 MG tablet Take 50 mg by mouth at bedtime as needed for sleep.    Historical Provider, MD  erythromycin ophthalmic ointment Place a 1/2 inch ribbon of ointment into  the lower eyelid. 05/19/16   Daryl F de Villier II, PA  ferrous sulfate 325 (65 FE) MG tablet Take 1 tablet (325 mg total) by mouth 2 (two) times daily with a meal. 12/31/15   Artist Pais Rasch, NP  HYDROcodone-homatropine (HYCODAN) 5-1.5 MG/5ML syrup Take 5 mLs by mouth every 6 (six) hours as needed for cough. 07/27/16   Heather Schwartz, MD  megestrol (MEGACE) 40 MG tablet Take 1 tablet (40 mg total) by mouth 3 (three) times daily. 12/31/15   Lezlie Lye, NP  traMADol (ULTRAM) 50 MG tablet Take 1 tablet (50 mg total) by mouth every 6 (six) hours as needed. 12/31/15   Lezlie Lye, NP    Family History No family history on file.  Social History Social History  Substance Use Topics  . Smoking status: Never Smoker  . Smokeless tobacco: Never Used  . Alcohol use Yes     Allergies   Penicillins   Review of Systems Review of Systems  Respiratory: Positive for cough and wheezing.   All other systems reviewed and are negative.    Physical Exam Updated Vital Signs BP 133/84 (BP Location: Left Arm)   Pulse 102   Temp 98.3 F (36.8 C) (Oral)   Resp 18   Ht 5\' 5"  (1.651 m)   Wt (!) 301  lb (136.5 kg)   LMP 07/27/2016   SpO2 98%   BMI 50.09 kg/m   Physical Exam  Constitutional: She is oriented to person, place, and time. She appears well-developed and well-nourished. No distress.  HENT:  Head: Normocephalic and atraumatic.  Eyes: Pupils are equal, round, and reactive to light.  Neck: Normal range of motion.  Cardiovascular: Normal rate and intact distal pulses.   Pulmonary/Chest: Effort normal. She has wheezes.  Abdominal: Normal appearance. She exhibits no distension.  Musculoskeletal: Normal range of motion.  Neurological: She is alert and oriented to person, place, and time. No cranial nerve deficit.  Skin: Skin is warm and dry. No rash noted.  Psychiatric: She has a normal mood and affect. Her behavior is normal.  Nursing note and vitals reviewed.    ED  Treatments / Results   DIAGNOSTIC STUDIES: Oxygen Saturation is 98% on RA, normal by my interpretation.    COORDINATION OF CARE: 6:56 PM Discussed treatment plan with pt at bedside which includes breathing treatment, antibiotics and pt agreed to plan.    Labs (all labs ordered are listed, but only abnormal results are displayed) Labs Reviewed - No data to display  EKG  EKG Interpretation None       Radiology No results found.  Procedures Procedures (including critical care time)  Medications Ordered in ED Medications  albuterol (PROVENTIL HFA;VENTOLIN HFA) 108 (90 Base) MCG/ACT inhaler 2 puff (not administered)  albuterol (PROVENTIL) (2.5 MG/3ML) 0.083% nebulizer solution 5 mg (5 mg Nebulization Given 07/27/16 1917)  ipratropium (ATROVENT) nebulizer solution 0.5 mg (0.5 mg Nebulization Given 07/27/16 1917)  dexamethasone (DECADRON) injection 10 mg (10 mg Intramuscular Given 07/27/16 1917)     Initial Impression / Assessment and Plan / ED Course  I have reviewed the triage vital signs and the nursing notes.  Pertinent labs & imaging results that were available during my care of the patient were reviewed by me and considered in my medical decision making (see chart for details).  Clinical Course   After treatment in the ED the patient feels back to baseline and wants to go home.    Final Clinical Impressions(s) / ED Diagnoses   Final diagnoses:  Mild intermittent asthma without complication  Bronchitis    New Prescriptions New Prescriptions   AZITHROMYCIN (ZITHROMAX) 250 MG TABLET    Take 1 tablet (250 mg total) by mouth daily. Take first 2 tablets together, then 1 every day until finished.   HYDROCODONE-HOMATROPINE (HYCODAN) 5-1.5 MG/5ML SYRUP    Take 5 mLs by mouth every 6 (six) hours as needed for cough.   I personally performed the services described in this documentation, which was scribed in my presence. The recorded information has been reviewed and  considered.    Heather Schwartz, MD 07/27/16 2006

## 2016-07-27 NOTE — ED Notes (Signed)
Due to pt eating ice unable to get temp

## 2016-07-29 ENCOUNTER — Emergency Department (HOSPITAL_COMMUNITY)
Admission: EM | Admit: 2016-07-29 | Discharge: 2016-07-30 | Discharge status: Against Medical Advice | Payer: Self-pay | Attending: Emergency Medicine | Admitting: Emergency Medicine

## 2016-07-29 ENCOUNTER — Encounter (HOSPITAL_COMMUNITY): Payer: Self-pay | Admitting: Emergency Medicine

## 2016-07-29 DIAGNOSIS — Z5321 Procedure and treatment not carried out due to patient leaving prior to being seen by health care provider: Secondary | ICD-10-CM | POA: Insufficient documentation

## 2016-07-29 DIAGNOSIS — R197 Diarrhea, unspecified: Secondary | ICD-10-CM | POA: Insufficient documentation

## 2016-07-29 DIAGNOSIS — R112 Nausea with vomiting, unspecified: Secondary | ICD-10-CM | POA: Insufficient documentation

## 2016-07-29 DIAGNOSIS — R1084 Generalized abdominal pain: Secondary | ICD-10-CM | POA: Insufficient documentation

## 2016-07-29 MED ORDER — ONDANSETRON 4 MG PO TBDP
4.0000 mg | ORAL_TABLET | Freq: Once | ORAL | Status: AC | PRN
  Administered 2016-07-29: 4 mg via ORAL
  Filled 2016-07-29: qty 1

## 2016-07-29 NOTE — ED Triage Notes (Signed)
Per EMS patient c/o generalized abd with n/v/d that started today.

## 2016-07-29 NOTE — ED Notes (Signed)
Pt was called for room placement and pt was seen going outside earlier. Called pt outside no response.
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
## 2016-07-29 NOTE — ED Notes (Signed)
Patient called from lobby to take back to treatment room.  Patient stated that she wanted to wait for visitor that is on their way here because ED on lock down and visitor wouldn't be able to come back to her room.  Informed patient that she will have to wait in lobby if she doesn't want to come back at this time and another patient will go ahead of her. Patient fine with waiting in lobby for friend at this time.
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
## 2016-07-29 NOTE — ED Notes (Signed)
Attempted blood draw twice and was unsuccessful 

## 2016-08-01 ENCOUNTER — Encounter (HOSPITAL_COMMUNITY): Payer: Self-pay | Admitting: *Deleted

## 2016-08-22 NOTE — Patient Instructions (Signed)
Your procedure is scheduled on:  Tomorrow, Jan. 9, 2018  Enter through the [employer] [facility] at:  6:00 AM  Pick up the phone at the desk and dial [phone].  Call this number if you have problems the morning of surgery: [phone].  Remember: Do NOT eat food or drink after:  Midnight tonight  Take these medicines the morning of surgery with a SIP OF WATER:  None  Bring Asthma inhaler day of surgery  Stop ALL herbal medications at this time  Do NOT smoke the day of surgery.  Do NOT wear jewelry (body piercing), metal hair clips/bobby pins, make-up, or nail polish. Do NOT wear lotions, powders, or perfumes.  You may wear deodorant. Do NOT shave for 48 hours prior to surgery. Do NOT bring valuables to the hospital. Contacts, dentures, or bridgework may not be worn into surgery.  Leave suitcase in car.  After surgery it may be brought to your room.  For patients admitted to the hospital, checkout time is 11:00 AM the day of discharge.  Have a responsible adult drive you home and stay with you for 24 hours after your procedure  Bring a copy of your healthcare power of attorney and living will documents.

## 2016-08-25 ENCOUNTER — Encounter (HOSPITAL_COMMUNITY): Payer: Self-pay

## 2016-08-25 ENCOUNTER — Encounter (HOSPITAL_COMMUNITY)
Admission: RE | Admit: 2016-08-25 | Discharge: 2016-08-25 | Disposition: A | Discharge status: Home / Self Care | Payer: Self-pay | Source: Ambulatory Visit | Attending: Obstetrics & Gynecology | Admitting: Obstetrics & Gynecology

## 2016-08-25 HISTORY — DX: Dorsalgia, unspecified: M54.9

## 2016-08-25 HISTORY — DX: Gastro-esophageal reflux disease without esophagitis: K21.9

## 2016-08-25 HISTORY — DX: Anemia, unspecified: D64.9

## 2016-08-25 HISTORY — DX: Cardiac arrhythmia, unspecified: I49.9

## 2016-08-25 MED ORDER — DEXTROSE 5 % IV SOLN
INTRAVENOUS | Status: DC
  Filled 2016-08-25: qty 11

## 2016-08-26 ENCOUNTER — Encounter (HOSPITAL_COMMUNITY): Admission: RE | Payer: Self-pay | Source: Ambulatory Visit

## 2016-08-26 ENCOUNTER — Other Ambulatory Visit: Payer: Self-pay | Admitting: Obstetrics & Gynecology

## 2016-08-26 ENCOUNTER — Ambulatory Visit (HOSPITAL_COMMUNITY): Admission: RE | Admit: 2016-08-26 | Payer: Self-pay | Source: Ambulatory Visit | Admitting: Obstetrics & Gynecology

## 2016-08-26 DIAGNOSIS — R590 Localized enlarged lymph nodes: Secondary | ICD-10-CM

## 2016-08-26 SURGERY — ROBOTIC ASSISTED TOTAL HYSTERECTOMY WITH SALPINGECTOMY
Anesthesia: General | Laterality: Bilateral

## 2016-09-02 NOTE — Progress Notes (Signed)
Called pt and notified her that Dr. Ihor Dow needs her to have repeat CT scan of abdomen/pelvis prior to her scheduled visit on 1/25. Pt voiced understanding and agreed to appt on 1/24 @ 0930 @ [facility].  She was informed to pick up the contrast drink at least 1 Devante Capano prior to her test and she can have nothing but water for the 4 hours prior to her test. Pt voiced understanding ot all information and instructions given.

## 2016-09-05 ENCOUNTER — Encounter (HOSPITAL_COMMUNITY): Payer: Self-pay

## 2016-09-08 ENCOUNTER — Encounter: Payer: Self-pay | Admitting: *Deleted

## 2016-09-10 ENCOUNTER — Ambulatory Visit (HOSPITAL_COMMUNITY): Admission: RE | Admit: 2016-09-10 | Payer: Self-pay | Source: Ambulatory Visit

## 2016-09-10 ENCOUNTER — Ambulatory Visit (HOSPITAL_COMMUNITY): Payer: Self-pay

## 2016-09-11 ENCOUNTER — Ambulatory Visit: Payer: Self-pay | Admitting: Obstetrics & Gynecology

## 2016-09-17 ENCOUNTER — Emergency Department (HOSPITAL_COMMUNITY)
Admission: EM | Admit: 2016-09-17 | Discharge: 2016-09-17 | Disposition: A | Discharge status: Home / Self Care | Payer: Self-pay | Attending: Emergency Medicine | Admitting: Emergency Medicine

## 2016-09-17 ENCOUNTER — Emergency Department (HOSPITAL_COMMUNITY): Payer: Self-pay

## 2016-09-17 ENCOUNTER — Encounter (HOSPITAL_COMMUNITY): Payer: Self-pay | Admitting: Emergency Medicine

## 2016-09-17 ENCOUNTER — Ambulatory Visit (HOSPITAL_COMMUNITY): Admission: RE | Admit: 2016-09-17 | Payer: Self-pay | Source: Ambulatory Visit

## 2016-09-17 DIAGNOSIS — J45901 Unspecified asthma with (acute) exacerbation: Secondary | ICD-10-CM | POA: Insufficient documentation

## 2016-09-17 LAB — CBC WITH DIFFERENTIAL/PLATELET
Basophils Absolute: 0 10*3/uL (ref 0.0–0.1)
Basophils Relative: 0 %
Eosinophils Absolute: 0.3 10*3/uL (ref 0.0–0.7)
Eosinophils Relative: 5 %
HCT: 33.2 % — ABNORMAL LOW (ref 36.0–46.0)
Hemoglobin: 10.3 g/dL — ABNORMAL LOW (ref 12.0–15.0)
LYMPHS ABS: 1 10*3/uL (ref 0.7–4.0)
LYMPHS PCT: 15 %
MCH: 24.6 pg — AB (ref 26.0–34.0)
MCHC: 31 g/dL (ref 30.0–36.0)
MCV: 79.4 fL (ref 78.0–100.0)
MONOS PCT: 8 %
Monocytes Absolute: 0.5 10*3/uL (ref 0.1–1.0)
Neutro Abs: 4.9 10*3/uL (ref 1.7–7.7)
Neutrophils Relative %: 72 %
PLATELETS: 413 10*3/uL — AB (ref 150–400)
RBC: 4.18 MIL/uL (ref 3.87–5.11)
RDW: 15.9 % — ABNORMAL HIGH (ref 11.5–15.5)
WBC: 6.8 10*3/uL (ref 4.0–10.5)

## 2016-09-17 LAB — BASIC METABOLIC PANEL
Anion gap: 10 (ref 5–15)
BUN: 9 mg/dL (ref 6–20)
CHLORIDE: 106 mmol/L (ref 101–111)
CO2: 21 mmol/L — AB (ref 22–32)
CREATININE: 1.24 mg/dL — AB (ref 0.44–1.00)
Calcium: 8.9 mg/dL (ref 8.9–10.3)
GFR calc Af Amer: >60 mL/min (ref >60)
GFR calc non Af Amer: 53 mL/min — ABNORMAL LOW (ref >60)
GLUCOSE: 102 mg/dL — AB (ref 65–99)
POTASSIUM: 3.3 mmol/L — AB (ref 3.5–5.1)
Sodium: 137 mmol/L (ref 135–145)

## 2016-09-17 MED ORDER — METHYLPREDNISOLONE SODIUM SUCC 125 MG IJ SOLR
125.0000 mg | Freq: Once | INTRAMUSCULAR | Status: AC
Start: 2016-09-17 — End: 2016-09-17
  Administered 2016-09-17: 125 mg via INTRAMUSCULAR
  Filled 2016-09-17: qty 2

## 2016-09-17 MED ORDER — ALBUTEROL SULFATE HFA 108 (90 BASE) MCG/ACT IN AERS: 2.0000 | INHALATION_SPRAY | RESPIRATORY_TRACT | 1 refills | Status: DC | PRN

## 2016-09-17 MED ORDER — ALBUTEROL SULFATE HFA 108 (90 BASE) MCG/ACT IN AERS
2.0000 | INHALATION_SPRAY | Freq: Once | RESPIRATORY_TRACT | Status: AC
  Administered 2016-09-17: 2 via RESPIRATORY_TRACT
  Filled 2016-09-17: qty 6.7

## 2016-09-17 MED ORDER — PREDNISONE 10 MG (21) PO TBPK: ORAL_TABLET | ORAL | 0 refills | Status: DC

## 2016-09-17 MED ORDER — IPRATROPIUM-ALBUTEROL 0.5-2.5 (3) MG/3ML IN SOLN
3.0000 mL | Freq: Once | RESPIRATORY_TRACT | Status: AC
  Administered 2016-09-17: 3 mL via RESPIRATORY_TRACT
  Filled 2016-09-17: qty 3

## 2016-09-17 MED ORDER — BENZONATATE 100 MG PO CAPS: 100.0000 mg | ORAL_CAPSULE | Freq: Three times a day (TID) | ORAL | 0 refills | Status: DC

## 2016-09-17 NOTE — ED Triage Notes (Signed)
Pt to ER with complaint of shortness of breath worsening over the past 48 hours. Hx of asthma. Dyspneic with exertion and moderately at rest. A/o x4. Expiratory wheezing noted throughout. Reports 48 hours of congestion as well.

## 2016-09-17 NOTE — Discharge Instructions (Signed)
You have been seen today for an asthma exacerbation. There were no significant abnormalities on her labs or chest x-ray today, other than some very minor low potassium. Refer to the attached information sheet for foods that contain potassium and try to increase your intake of these foods. Please select and follow up with a primary care provider as soon as possible for continued and chronic management of your asthma. Return to the ED should symptoms worsen.

## 2016-09-17 NOTE — ED Provider Notes (Signed)
[facility] DEPT Provider Note   CSN: [id number] Arrival date & time: 09/17/16  0741     History   Chief Complaint Chief Complaint  Patient presents with  . Shortness of Breath    HPI Heather Graham is a 41 y.o. female.  HPI   Heather Graham is a 41 y.o. female, with a history of asthma and anemia, presenting to the ED with shortness of breath beginning last night. Also endorses productive cough with green sputum, subjective fever, chills, and nasal congestion for the last 3 days. Has tried benadryl with no improvement.  Patient was seen in December 2017 for an asthma exacerbation and given azithromycin and Hycodan cough syrup. She was also sent home with a small albuterol inhaler. She still has not yet secured a PCP.   Denies N/V/D, chest pain, abdominal pain, or any other complaints.    Past Medical History:  Diagnosis Date  . Anemia   . Asthma   . Back pain   . GERD (gastroesophageal reflux disease)   . Irregular heart beat     There are no active problems to display for this patient.   Past Surgical History:  Procedure Laterality Date  . TUBAL LIGATION      OB History    Gravida Para Term Preterm AB Living   2 2       2    SAB TAB Ectopic Multiple Live Births                   Home Medications    Prior to Admission medications   Medication Sig Start Date End Date Taking? Authorizing Provider  albuterol (PROVENTIL HFA;VENTOLIN HFA) 108 (90 Base) MCG/ACT inhaler Inhale 2 puffs into the lungs every 4 (four) hours as needed for wheezing or shortness of breath. 01/05/16  Yes Ugonna A Anyanwu, MD  diphenhydrAMINE (BENADRYL) 25 MG tablet Take 50 mg by mouth at bedtime as needed for sleep.   Yes Historical Provider, MD  guaiFENesin (ROBITUSSIN) 100 MG/5ML liquid Take 200 mg by mouth 3 (three) times daily as needed for cough.   Yes Historical Provider, MD  Isopropyl Alcohol (SWIMMERS EAR DROPS OT) Place 1 drop into both eyes daily as needed (discomfort).    Yes Historical Provider, MD  megestrol (MEGACE) 40 MG tablet Take 1 tablet (40 mg total) by mouth 3 (three) times daily. 12/31/15  Yes Lezlie Lye, NP  Multiple Vitamin (MULTIVITAMIN) tablet Take 1 tablet by mouth daily.   Yes Historical Provider, MD  albuterol (PROVENTIL HFA;VENTOLIN HFA) 108 (90 Base) MCG/ACT inhaler Inhale 2 puffs into the lungs every 4 (four) hours as needed for wheezing or shortness of breath. 09/17/16   Sibley Rolison C Rivers Hamrick, PA-C  benzonatate (TESSALON) 100 MG capsule Take 1 capsule (100 mg total) by mouth every 8 (eight) hours. 09/17/16   Danicia Terhaar C Nikolas Casher, PA-C  erythromycin ophthalmic ointment Place a 1/2 inch ribbon of ointment into the lower eyelid. Patient not taking: Reported on 09/17/2016 05/19/16   Daryl F de Villier II, PA  ferrous sulfate 325 (65 FE) MG tablet Take 1 tablet (325 mg total) by mouth 2 (two) times daily with a meal. Patient not taking: Reported on 09/17/2016 12/31/15   Lezlie Lye, NP  HYDROcodone-homatropine (HYCODAN) 5-1.5 MG/5ML syrup Take 5 mLs by mouth every 6 (six) hours as needed for cough. Patient not taking: Reported on 09/17/2016 07/27/16   Leonard Schwartz, MD  predniSONE (STERAPRED UNI-PAK 21 TAB) 10 MG (21) TBPK tablet  Take 6 tabs day 1, 5 tabs day 2, 4 tabs day 3, 3 tabs day 4, 2 tabs day 5, and 1 tab on day 6. 09/17/16   Gardy Montanari C Kevork Joyce, PA-C  traMADol (ULTRAM) 50 MG tablet Take 1 tablet (50 mg total) by mouth every 6 (six) hours as needed. Patient not taking: Reported on 09/17/2016 12/31/15   Lezlie Lye, NP    Family History History reviewed. No pertinent family history.  Social History Social History  Substance Use Topics  . Smoking status: Never Smoker  . Smokeless tobacco: Never Used  . Alcohol use Yes     Allergies   Codeine; Penicillins; and Tramadol   Review of Systems Review of Systems  Constitutional: Positive for chills and fever (subjective).  HENT: Positive for congestion.   Respiratory: Positive for cough, shortness of  breath and wheezing.   Cardiovascular: Negative for chest pain.  Gastrointestinal: Negative for abdominal pain, diarrhea, nausea and vomiting.  Neurological: Negative for dizziness, light-headedness and headaches.  All other systems reviewed and are negative.    Physical Exam Updated Vital Signs BP 170/95 (BP Location: Right Arm)   Pulse 93   Temp 98.7 F (37.1 C) (Oral)   Resp 16   Ht 5\' 5"  (1.651 m)   Wt 133.8 kg   SpO2 100%   BMI 49.09 kg/m   Physical Exam  Constitutional: She appears well-developed and well-nourished. No distress.  HENT:  Head: Normocephalic and atraumatic.  Mouth/Throat: Oropharynx is clear and moist.  Eyes: Conjunctivae are normal.  Neck: Neck supple.  Cardiovascular: Normal rate, regular rhythm, normal heart sounds and intact distal pulses.   Pulmonary/Chest: She has wheezes.  Patient speaks in multiple full sentences at a time.  Abdominal: Soft. There is no tenderness. There is no guarding.  Musculoskeletal: She exhibits no edema.  Lymphadenopathy:    She has no cervical adenopathy.  Neurological: She is alert.  Skin: Skin is warm and dry. She is not diaphoretic.  Psychiatric: She has a normal mood and affect. Her behavior is normal.  Nursing note and vitals reviewed.    ED Treatments / Results  Labs (all labs ordered are listed, but only abnormal results are displayed) Labs Reviewed  BASIC METABOLIC PANEL - Abnormal; Notable for the following:       Result Value   Potassium 3.3 (*)    CO2 21 (*)    Glucose, Bld 102 (*)    Creatinine, Ser 1.24 (*)    GFR calc non Af Amer 53 (*)    All other components within normal limits  CBC WITH DIFFERENTIAL/PLATELET - Abnormal; Notable for the following:    Hemoglobin 10.3 (*)    HCT 33.2 (*)    MCH 24.6 (*)    RDW 15.9 (*)    Platelets 413 (*)    All other components within normal limits   Hemoglobin  Date Value Ref Range Status  09/17/2016 10.3 (L) 12.0 - 15.0 g/dL Final  12/31/2015 7.7  (L) 12.0 - 15.0 g/dL Final  09/26/2015 7.3 (L) 12.0 - 15.0 g/dL Final  04/05/2015 7.6 (L) 12.0 - 15.0 g/dL Final   BUN  Date Value Ref Range Status  09/17/2016 9 6 - 20 mg/dL Final  09/26/2015 9 6 - 20 mg/dL Final  04/05/2015 19 6 - 20 mg/dL Final  09/13/2013 6 6 - 23 mg/dL Final   Creatinine, Ser  Date Value Ref Range Status  09/17/2016 1.24 (H) 0.44 - 1.00 mg/dL Final  09/26/2015  1.04 (H) 0.44 - 1.00 mg/dL Final  04/05/2015 1.01 (H) 0.44 - 1.00 mg/dL Final  09/13/2013 0.94 0.50 - 1.10 mg/dL Final     EKG  EKG Interpretation None       Radiology Dg Chest 2 View  Result Date: 09/17/2016 CLINICAL DATA:  Cough and shortness of Breath EXAM: CHEST  2 VIEW COMPARISON:  09/25/2015 FINDINGS: The heart size and mediastinal contours are within normal limits. Both lungs are clear. The visualized skeletal structures are unremarkable. IMPRESSION: No active cardiopulmonary disease. Electronically Signed   By: Inez Catalina M.D.   On: 09/17/2016 08:43    Procedures Procedures (including critical care time)  Medications Ordered in ED Medications  ipratropium-albuterol (DUONEB) 0.5-2.5 (3) MG/3ML nebulizer solution 3 mL (3 mLs Nebulization Given 09/17/16 0844)  methylPREDNISolone sodium succinate (SOLU-MEDROL) 125 mg/2 mL injection 125 mg (125 mg Intramuscular Given 09/17/16 0844)  albuterol (PROVENTIL HFA;VENTOLIN HFA) 108 (90 Base) MCG/ACT inhaler 2 puff (2 puffs Inhalation Given 09/17/16 0844)     Initial Impression / Assessment and Plan / ED Course  I have reviewed the triage vital signs and the nursing notes.  Pertinent labs & imaging results that were available during my care of the patient were reviewed by me and considered in my medical decision making (see chart for details).      Patient presents with increased wheezing and shortness of breath. Suspect asthma exacerbation. Patient voiced improvement with DuoNeb and then requested to be discharged. Patient maintains some  minor expiratory wheezing, but no indications of difficulty breathing. Patient ambulated without difficulty, dyspnea, or drop in SPO2. Minor hypokalemia noted and this was addressed at discharge. Small increase in creatinine suspected to be from some minor dehydration. Patient was again advised to secure a PCP for chronic management of her asthma. She was discharged with an albuterol inhaler for use until she can fill her prescriptions.  Further home care and return precautions discussed. Patient voices understanding of all instructions and is comfortable discharge.   Vitals:   09/17/16 0800 09/17/16 0915 09/17/16 0927 09/17/16 0955  BP: 155/99 (!) 170/121    Pulse: 91 (!) 52 84 88  Resp: 16 26 18 16   Temp:      TempSrc:      SpO2: 100% 93% 100% 100%  Weight:      Height:       Patient's episode of hypertension is noted. She seems to be asymptomatic at this time. No symptoms of hypertensive emergency.   Final Clinical Impressions(s) / ED Diagnoses   Final diagnoses:  Exacerbation of asthma, unspecified asthma severity, unspecified whether persistent    New Prescriptions Discharge Medication List as of 09/17/2016  9:45 AM    START taking these medications   Details  !! albuterol (PROVENTIL HFA;VENTOLIN HFA) 108 (90 Base) MCG/ACT inhaler Inhale 2 puffs into the lungs every 4 (four) hours as needed for wheezing or shortness of breath., Starting Wed 09/17/2016, Print    benzonatate (TESSALON) 100 MG capsule Take 1 capsule (100 mg total) by mouth every 8 (eight) hours., Starting Wed 09/17/2016, Print    predniSONE (STERAPRED UNI-PAK 21 TAB) 10 MG (21) TBPK tablet Take 6 tabs day 1, 5 tabs day 2, 4 tabs day 3, 3 tabs day 4, 2 tabs day 5, and 1 tab on day 6., Print     !! - Potential duplicate medications found. Please discuss with provider.       Lorayne Bender, PA-C 09/17/16 [place]  [place], MD 09/17/16 1944

## 2016-09-22 ENCOUNTER — Ambulatory Visit (HOSPITAL_COMMUNITY): Payer: Self-pay

## 2016-09-24 ENCOUNTER — Telehealth (HOSPITAL_COMMUNITY): Payer: Self-pay

## 2016-09-24 NOTE — Telephone Encounter (Signed)
Called and spoke w/ Heather Graham to try and obtain her current insurance information. [name] states she doesn't have insurance, but she received the financial assistance application and she states she has filled it out and returned it. Advised her to keep her appt on Monday 02/12, and to follow up w/ their financial counselor when she comes in. Pt understands.

## 2016-09-25 ENCOUNTER — Ambulatory Visit (HOSPITAL_COMMUNITY)
Admission: RE | Admit: 2016-09-25 | Discharge: 2016-09-25 | Disposition: A | Discharge status: Home / Self Care | Payer: Self-pay | Source: Ambulatory Visit | Attending: Obstetrics & Gynecology | Admitting: Obstetrics & Gynecology

## 2016-09-25 DIAGNOSIS — R590 Localized enlarged lymph nodes: Secondary | ICD-10-CM

## 2016-09-29 ENCOUNTER — Ambulatory Visit (INDEPENDENT_AMBULATORY_CARE_PROVIDER_SITE_OTHER): Payer: Self-pay | Admitting: Obstetrics & Gynecology

## 2016-09-29 ENCOUNTER — Encounter: Payer: Self-pay | Admitting: Obstetrics & Gynecology

## 2016-09-29 VITALS — BP 116/91 | HR 125 | Ht 65.0 in | Wt 299.0 lb

## 2016-09-29 DIAGNOSIS — D252 Subserosal leiomyoma of uterus: Secondary | ICD-10-CM

## 2016-09-29 DIAGNOSIS — D251 Intramural leiomyoma of uterus: Secondary | ICD-10-CM

## 2016-09-29 NOTE — Patient Instructions (Signed)

## 2016-09-29 NOTE — Progress Notes (Signed)
History:  41 y.o. G2P2 here today for eval of pelvic pain. Heather Graham was scheduled for a RAHT due to pelvic pain and fibroids.  She went ot her preop appt and reported that she was not aware that she was having a hyst. Her surgery was cancelled.  Prior to her preop assessment visit with the nurses, she did not keep her MD preop appt. So, her surgery was cancelled. She is now here for a f/u visit. She reports that she wants to schedule her surger.y  She had a lymph node abnormality on her CT and was supposed to go for a repot CT. She had cancelled or was a no show for several appts.  Heather Graham is still on Megace 'as needed'.  She takes the Megace when she feels that her menses have gone too long. She takes '2 or 3 when she has periods'. She repots increased appetite and weight gain when she was taking it daily.  She reports that her cycles are occurring monthly with some pain and spotting before her cycles.  Heather Graham reports that she prev wanted more than 2 kids but, now she's ok to 'just have the hysterectomy.'  The following portions of the patient's history were reviewed and updated as appropriate: allergies, current medications, past family history, past medical history, past social history, past surgical history and problem list.  Review of Systems:  Pertinent items are noted in HPI.   Objective:  Physical Exam Blood pressure (!) 116/91, pulse (!) 125, height 5\' 5"  (1.651 m), weight 299 lb (135.6 kg). BP (!) 116/91   Pulse (!) 125   Ht 5\' 5"  (1.651 m)   Wt 299 lb (135.6 kg)   BMI 49.76 kg/m  CONSTITUTIONAL: Well-developed, well-nourished female in no acute distress.  HENT:  Normocephalic, atraumatic EYES: Conjunctivae and EOM are normal. No scleral icterus.  NECK: Normal range of motion SKIN: Skin is warm and dry. No rash noted. Not diaphoretic.No pallor. [place]: Alert and oriented to person, place, and time. Normal coordination.   Pelvic: PATIENT DECLINES PELVIC EXAM.  She reports that it was done prev  and she would do it prior to her surgery.   Labs and Imaging Dg Chest 2 View  Result Date: 09/17/2016 CLINICAL DATA:  Cough and shortness of Breath EXAM: CHEST  2 VIEW COMPARISON:  09/25/2015 FINDINGS: The heart size and mediastinal contours are within normal limits. Both lungs are clear. The visualized skeletal structures are unremarkable. IMPRESSION: No active cardiopulmonary disease. Electronically Signed   By: Inez Catalina M.D.   On: 09/17/2016 08:43   CBC    Component Value Date/Time   WBC 6.8 09/17/2016 0819   RBC 4.18 09/17/2016 0819   HGB 10.3 (L) 09/17/2016 0819   HCT 33.2 (L) 09/17/2016 0819   PLT 413 (H) 09/17/2016 0819   MCV 79.4 09/17/2016 0819   MCH 24.6 (L) 09/17/2016 0819   MCHC 31.0 09/17/2016 0819   RDW 15.9 (H) 09/17/2016 0819   LYMPHSABS 1.0 09/17/2016 0819   MONOABS 0.5 09/17/2016 0819   EOSABS 0.3 09/17/2016 0819   BASOSABS 0.0 09/17/2016 0819   01/17/2016 Diagnosis Endometrium, biopsy SCANT BENIGN ATROPHIC ENDOMETRIAL GLANDS  12/31/2015 CLINICAL DATA:  Abnormal uterine bleeding, pelvic pain, anemia  EXAM: TRANSABDOMINAL AND TRANSVAGINAL ULTRASOUND OF PELVIS  TECHNIQUE: Both transabdominal and transvaginal ultrasound examinations of the pelvis were performed. Transabdominal technique was performed for global imaging of the pelvis including uterus, ovaries, adnexal regions, and pelvic cul-de-sac. It was necessary to proceed with endovaginal exam  following the transabdominal exam to visualize the endometrium and right over.  COMPARISON:  None  FINDINGS: Uterus  Measurements: 12.1 x 6.9 x 8.4 cm. Multiple uterine fibroids, including a dominant 5.2 x 4.8 x 5.4 cm intramural fibroid in the left posterior uterine body. Additional 2.4 x 1.9 x 2.3 cm intramural fibroid in the right anterior uterine body and 2.0 x 1.3 x 1.7 cm subserosal fibroid in the right posterior uterine body.  Endometrium  Thickness: 12 mm.  No focal abnormality  visualized.  Right ovary  Measurements: 3.1 x 1.4 x 3.0 cm. Normal appearance/no adnexal mass.  Left ovary  Measurements: 2.7 x 1.1 x 2.5 cm. Normal appearance/no adnexal mass.  Other findings  No abnormal free fluid.  IMPRESSION: Uterine fibroids, including a dominant 5.2 cm intramural fibroid in the left posterior uterine body.  Endometrial complex measures 12 mm, within normal limits.  Bilateral ovaries are within normal limits.    Assessment & Plan:  Heather Graham with pelvic pain and fibroids. She has been noncompliant with her care.  She has not had the repeat CT and I have reported that I will not perform her surgery until the CT is completed and she has completed her financial aid paperwork as her insurance has lapsed.  I have explained that this is NOT emergent surgery so she will need to complete this paperwork before I can proceed.   Abnormal lymph node on CT- Heather Graham agrees to have the CT repeated. I have explained to her the importance of making sure the LA is not related to significant pathology.  F/u after all paperwork is completed.  Heather Graham informed that she will need a repeat pelvic exam before her surgegy can be scheduled to determine the mode of the surgery. She is requesting a TVH. I have advised her that this cannot be determined until a repeat pelvic exam is done.  Svea Pusch L. Harraway-Smith, M.D., [facility] June

## 2016-09-30 ENCOUNTER — Ambulatory Visit: Admit: 2016-09-30 | Payer: Self-pay | Admitting: Obstetrics & Gynecology

## 2016-09-30 SURGERY — ROBOTIC ASSISTED TOTAL HYSTERECTOMY WITH SALPINGECTOMY
Anesthesia: Choice

## 2017-01-09 ENCOUNTER — Emergency Department
Admission: EM | Admit: 2017-01-09 | Discharge: 2017-01-10 | Disposition: A | Discharge status: Home / Self Care | Payer: Self-pay | Attending: Emergency Medicine | Admitting: Emergency Medicine

## 2017-01-09 DIAGNOSIS — J45909 Unspecified asthma, uncomplicated: Secondary | ICD-10-CM

## 2017-01-09 DIAGNOSIS — D259 Leiomyoma of uterus, unspecified: Secondary | ICD-10-CM

## 2017-01-09 DIAGNOSIS — R109 Unspecified abdominal pain: Secondary | ICD-10-CM | POA: Insufficient documentation

## 2017-01-09 DIAGNOSIS — N939 Abnormal uterine and vaginal bleeding, unspecified: Secondary | ICD-10-CM

## 2017-01-09 LAB — COMPREHENSIVE METABOLIC PANEL
ALT (GPT): 12 U/L (ref 7–33)
AST (GOT): 15 U/L (ref 9–38)
Albumin: 3.9 g/dL (ref 3.5–5.2)
Alkaline Phosphatase (Total): 92 U/L (ref 25–112)
Anion Gap: 10 (ref 4–12)
Bilirubin (Total): 0.4 mg/dL (ref 0.2–1.3)
Calcium: 8.8 mg/dL — ABNORMAL LOW (ref 8.9–10.2)
Carbon Dioxide, Total: 24 meq/L (ref 22–32)
Chloride: 105 meq/L (ref 98–108)
Creatinine: 0.82 mg/dL (ref 0.38–1.02)
GFR, Calc, African American: >60 mL/min/{1.73_m2} (ref >59)
GFR, Calc, European American: >60 mL/min/{1.73_m2} (ref >59)
Glucose: 96 mg/dL (ref 62–125)
Potassium: 3.8 meq/L (ref 3.6–5.2)
Protein (Total): 7.3 g/dL (ref 6.0–8.2)
Sodium: 139 meq/L (ref 135–145)
Urea Nitrogen: 10 mg/dL (ref 8–21)

## 2017-01-09 LAB — CBC, DIFF
% Basophils: 1 %
% Eosinophils: 3 %
% Immature Granulocytes: 0 %
% Lymphocytes: 24 %
% Monocytes: 11 %
% Neutrophils: 61 %
% Nucleated RBC: 0 %
Absolute Eosinophil Count: 0.17 10*3/uL (ref 0.00–0.50)
Absolute Lymphocyte Count: 1.44 10*3/uL (ref 1.00–4.80)
Basophils: 0.06 10*3/uL (ref 0.00–0.20)
Hematocrit: 34 % — ABNORMAL LOW (ref 36–45)
Hemoglobin: 11 g/dL — ABNORMAL LOW (ref 11.5–15.5)
Immature Granulocytes: 0.02 10*3/uL (ref 0.00–0.05)
MCH: 27.3 pg (ref 27.3–33.6)
MCHC: 32.1 g/dL — ABNORMAL LOW (ref 32.2–36.5)
MCV: 85 fL (ref 81–98)
Monocytes: 0.64 10*3/uL (ref 0.00–0.80)
Neutrophils: 3.65 10*3/uL (ref 1.80–7.00)
Nucleated RBC: 0 10*3/uL
Platelet Count: 454 10*3/uL — ABNORMAL HIGH (ref 150–400)
RBC: 4.03 10*6/uL (ref 3.80–5.00)
RDW-CV: 14.2 % (ref 11.6–14.4)
WBC: 5.98 10*3/uL (ref 4.3–10.0)

## 2017-01-09 LAB — URINALYSIS COMPLETE, URN
Bacteria, URN: NONE SEEN
Bilirubin (Qual), URN: NEGATIVE
Epith Cells_Renal/Trans,URN: NEGATIVE /HPF
Glucose Qual, URN: NEGATIVE mg/dL
Ketones, URN: NEGATIVE mg/dL
Leukocyte Esterase, URN: NEGATIVE
Nitrite, URN: NEGATIVE
Specific Gravity, URN: 1.03 g/mL — ABNORMAL HIGH (ref 1.002–1.027)
Urobilinogen, URN: 4 {Ehrlich'U} — AB
WBC, URN: NEGATIVE /HPF

## 2017-01-09 LAB — PREGNANCY (HCG), SERUM, QUANT: Pregnancy (HCG), SRM: <1 m[IU]/mL (ref <6)

## 2017-01-09 LAB — PREGNANCY TEST, URINE
Pregnancy Test, URN: NEGATIVE
Specific Gravity, URN: 1.031 g/mL — ABNORMAL HIGH (ref 1.002–1.027)

## 2017-01-09 LAB — LAB ADD ON ORDER

## 2017-01-09 LAB — PROTHROMBIN TIME
Prothrombin INR: 1 (ref 0.8–1.3)
Prothrombin Time Patient: 13.3 s (ref 10.7–15.6)

## 2017-01-09 LAB — L LACTATE, VENOUS PLASMA (TO U/H LAB > 30 MIN): L Lactate (Indirect), VEN: 1.2 mmol/L (ref 0.5–2.2)

## 2017-01-09 LAB — FIBRINOGEN: Fibrinogen: 358 mg/dL (ref 150–450)

## 2017-01-10 LAB — LAB ADD ON ORDER

## 2017-01-10 LAB — THYROID STIMULATING HORMONE: Thyroid Stimulating Hormone: 1.919 u[IU]/mL (ref 0.400–5.000)

## 2017-01-14 ENCOUNTER — Telehealth (HOSPITAL_BASED_OUTPATIENT_CLINIC_OR_DEPARTMENT_OTHER): Payer: Self-pay

## 2017-01-14 NOTE — Telephone Encounter (Signed)
(  TEXTING IS AN OPTION FOR [facility] CLINICS ONLY)  Is this a [facility] clinic? No      RETURN CALL: Detailed message on voicemail only      SUBJECT:  General Message     REASON FOR REQUEST: urgent referral     MESSAGE: no scheduling instructions, attempted warm transfer

## 2017-01-15 ENCOUNTER — Telehealth (HOSPITAL_BASED_OUTPATIENT_CLINIC_OR_DEPARTMENT_OTHER): Payer: Self-pay | Admitting: Obstetrics & Gynecology

## 2017-01-15 NOTE — Telephone Encounter (Signed)
01/15/17 - Left message - No insurance information.     If no insurance, pt needs to contact [facility] before appt. Otherwise need  to sign [facility] form & pay $250 deposit. SL

## 2017-01-20 ENCOUNTER — Encounter (HOSPITAL_BASED_OUTPATIENT_CLINIC_OR_DEPARTMENT_OTHER): Payer: Self-pay

## 2017-01-20 ENCOUNTER — Ambulatory Visit (HOSPITAL_BASED_OUTPATIENT_CLINIC_OR_DEPARTMENT_OTHER): Payer: Medicaid Other | Attending: Emergency Medicine

## 2017-01-20 ENCOUNTER — Encounter (HOSPITAL_BASED_OUTPATIENT_CLINIC_OR_DEPARTMENT_OTHER): Payer: Self-pay | Admitting: Obstetrics & Gynecology

## 2017-01-20 ENCOUNTER — Telehealth (HOSPITAL_BASED_OUTPATIENT_CLINIC_OR_DEPARTMENT_OTHER): Payer: Self-pay | Admitting: Obstetrics & Gynecology

## 2017-01-20 NOTE — Telephone Encounter (Signed)
(  TEXTING IS AN OPTION FOR [facility] CLINICS ONLY)  Is this a [facility] clinic? No      RETURN CALL: General message OK      SUBJECT:  General Message     REASON FOR REQUEST: Running late to 01/20/17 visit    MESSAGE: Patient had an appointment today at 9:00am but is still trying to make it to the clinic today. She wanted to make sure she can still be seen. CCR attempted to warm transfer to the clinic, but care team was assisting other patients. Thank you!

## 2017-01-20 NOTE — Progress Notes (Signed)
Ms. Kristin Goodwin did not cancel and was not present for a scheduled appointment today.  Disposition: Chart reviewed, patient with abnormal uterine bleeding, seen in the ED 01/09/17, with gyn consult. Was hemodynamically stable with Hct 34% and was given Provera taper. patient was counseled on recommendation for endometrial biopsy. Also asked to bring records from prior care in [place]. Patient contacted by phone regarding follow-up.

## 2018-01-29 IMAGING — US US PELVIS COMPLETE
1 series · 15 of 25 positions shown · non-contrast
Comparison: None

CLINICAL DATA: Abnormal uterine bleeding, pelvic pain, anemia

EXAM:
TRANSABDOMINAL AND TRANSVAGINAL ULTRASOUND OF PELVIS
TECHNIQUE: Both transabdominal and transvaginal ultrasound examinations of the
pelvis were performed. Transabdominal technique was performed for
global imaging of the pelvis including uterus, ovaries, adnexal
regions, and pelvic cul-de-sac. It was necessary to proceed with
endovaginal exam following the transabdominal exam to visualize the
endometrium and right over.

[Series 1: us pelvis complete · 15 of 160 slices shown]
[im 1/160]
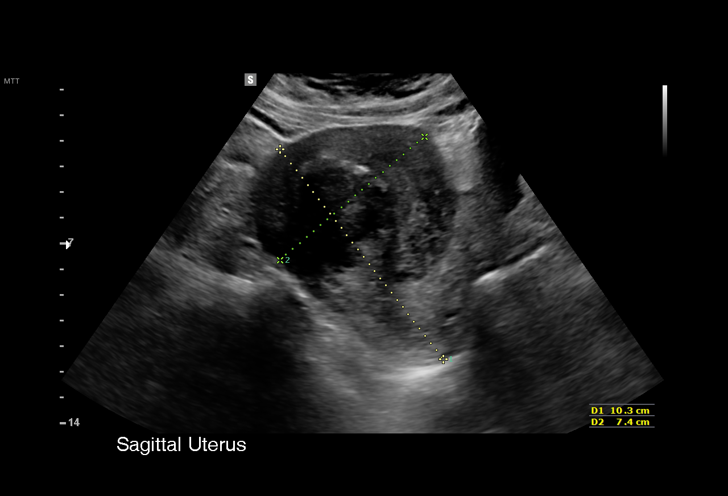
[im 14/160]
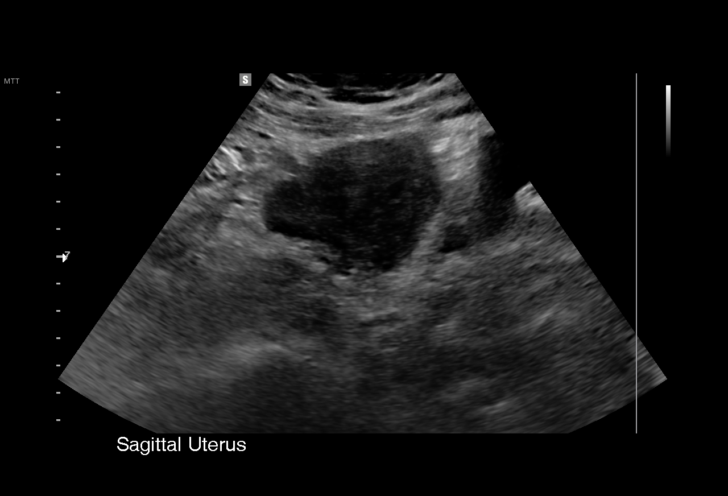
[im 27/160]
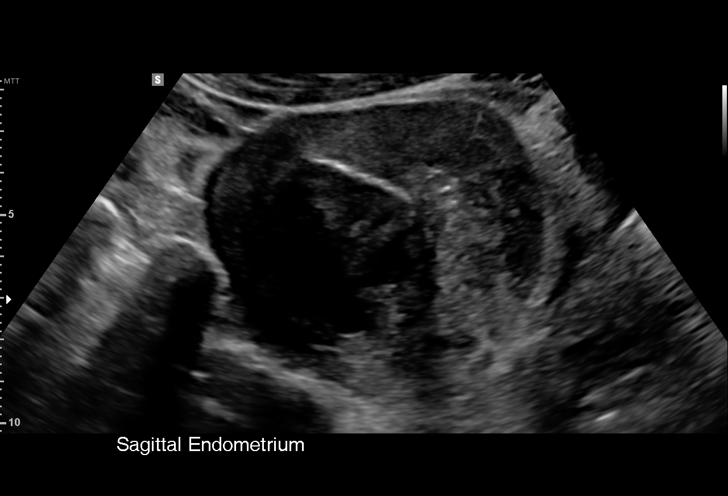
[im 34/160]
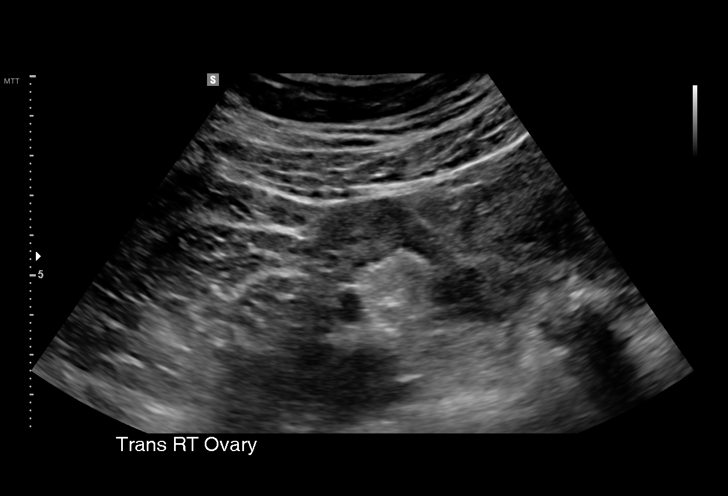
[im 47/160]
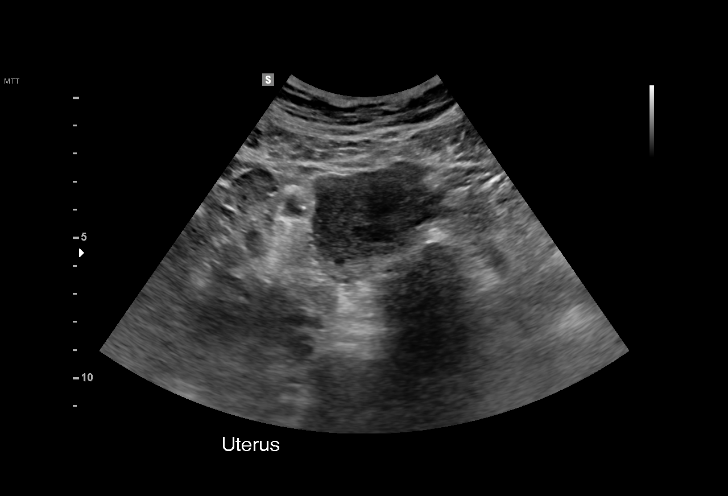
[im 60/160]
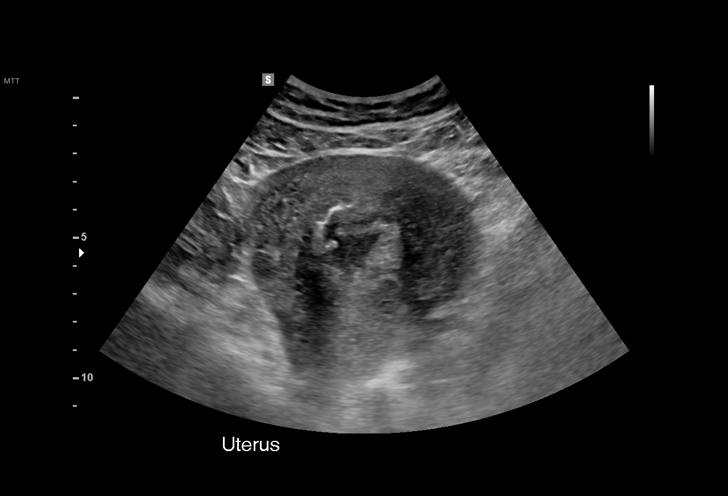
[im 67/160]
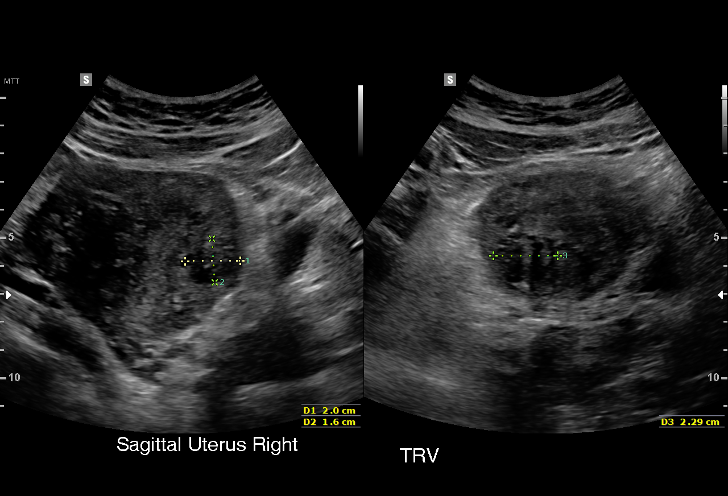
[im 80/160]
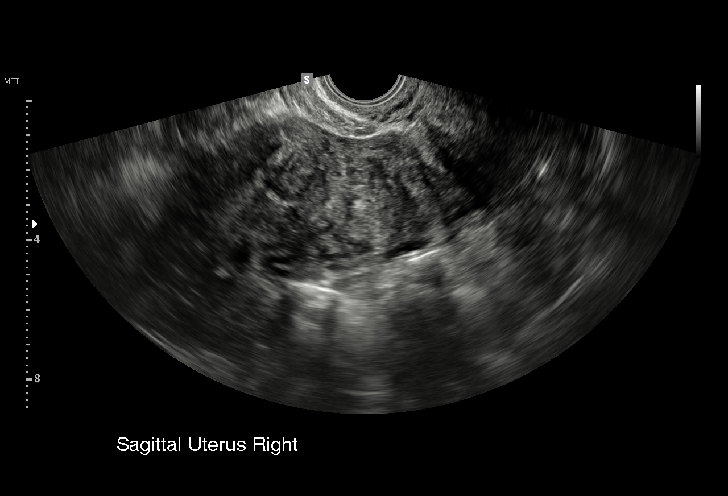
[im 93/160]
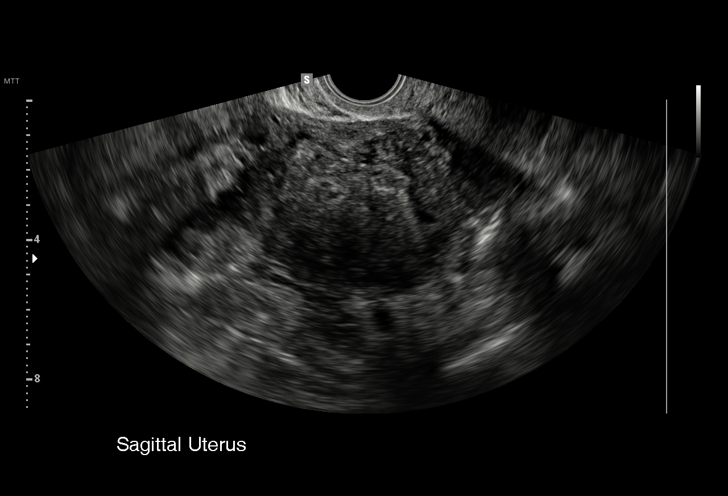
[im 100/160]
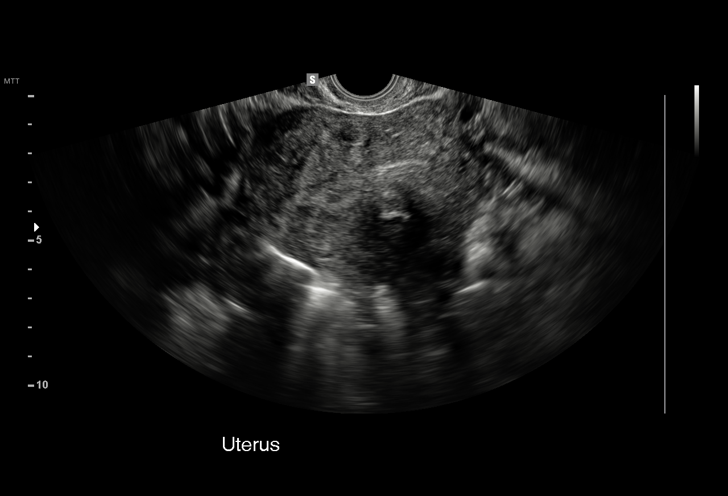
[im 113/160]
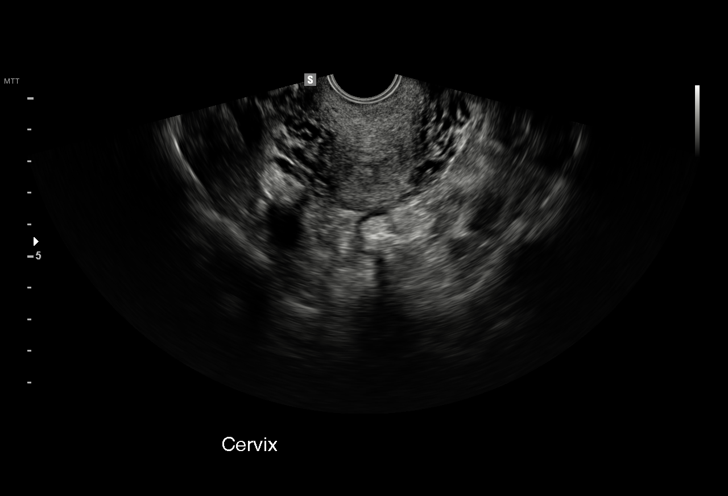
[im 126/160]
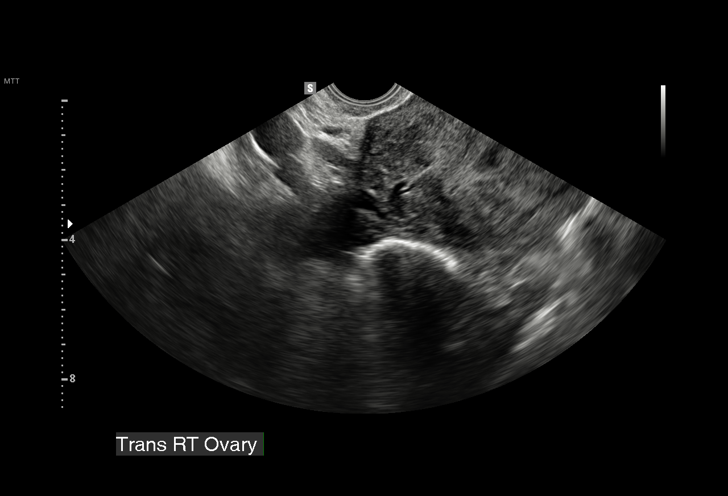
[im 133/160]
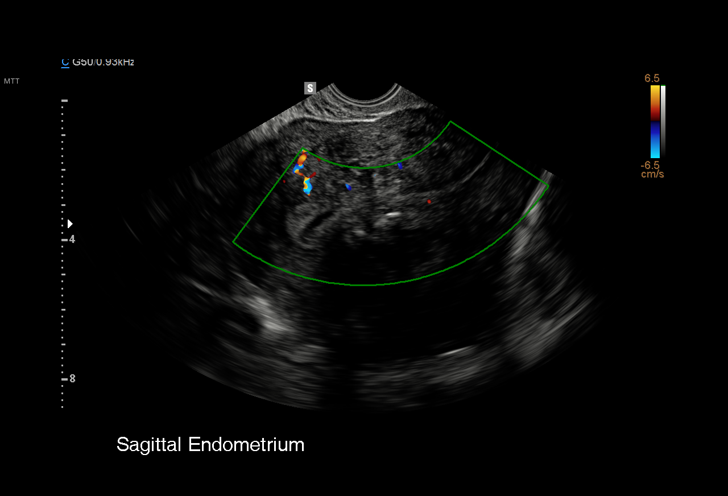
[im 146/160]
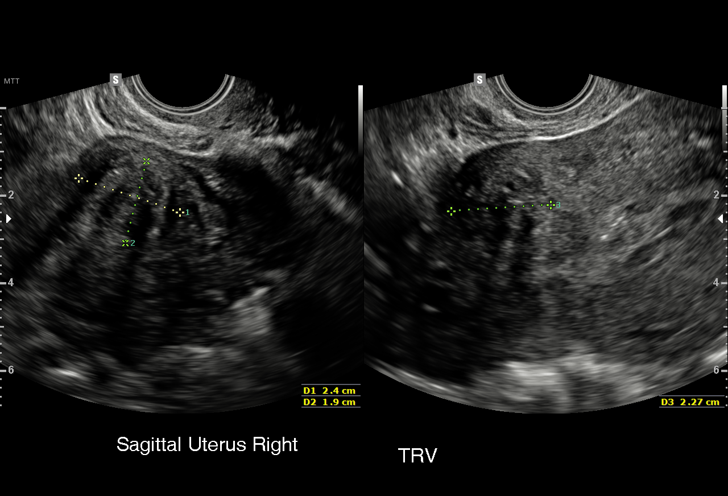
[im 160/160]
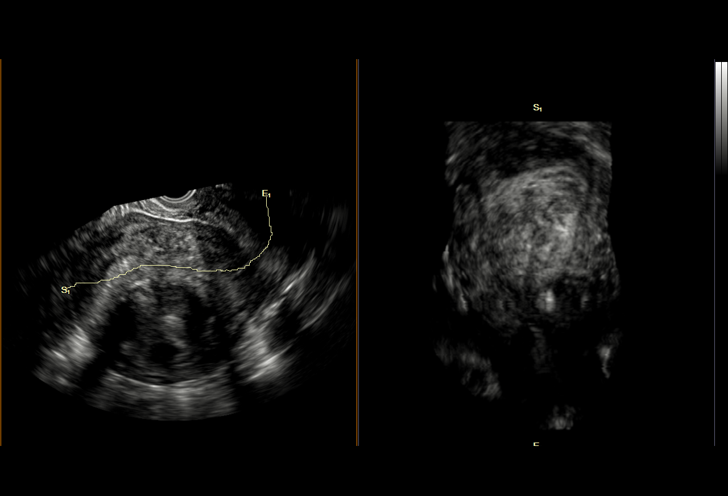

[15 of 25 positions shown; findings below may reference images not displayed]

FINDINGS: Uterus

Measurements: 12.1 x 6.9 x 8.4 cm. Multiple uterine fibroids,
including a dominant 5.2 x 4.8 x 5.4 cm intramural fibroid in the
left posterior uterine body. Additional 2.4 x 1.9 x 2.3 cm
intramural fibroid in the right anterior uterine body and 2.0 x
x 1.7 cm subserosal fibroid in the right posterior uterine body.

Endometrium

Thickness: 12 mm.  No focal abnormality visualized.

Right ovary

Measurements: 3.1 x 1.4 x 3.0 cm. Normal appearance/no adnexal mass.

Left ovary

Measurements: 2.7 x 1.1 x 2.5 cm. Normal appearance/no adnexal mass.

Other findings

No abnormal free fluid.
IMPRESSION: Uterine fibroids, including a dominant 5.2 cm intramural fibroid in
the left posterior uterine body.

Endometrial complex measures 12 mm, within normal limits.

Bilateral ovaries are within normal limits.

## 2018-05-09 ENCOUNTER — Inpatient Hospital Stay (HOSPITAL_COMMUNITY)
Admission: AD | Admit: 2018-05-09 | Discharge: 2018-05-09 | Disposition: A | Discharge status: Home / Self Care | Payer: Self-pay | Attending: Obstetrics & Gynecology | Admitting: Obstetrics & Gynecology

## 2018-05-09 ENCOUNTER — Encounter (HOSPITAL_COMMUNITY): Payer: Self-pay | Admitting: *Deleted

## 2018-05-09 DIAGNOSIS — Z880 Allergy status to penicillin: Secondary | ICD-10-CM | POA: Insufficient documentation

## 2018-05-09 DIAGNOSIS — N760 Acute vaginitis: Secondary | ICD-10-CM | POA: Insufficient documentation

## 2018-05-09 DIAGNOSIS — N938 Other specified abnormal uterine and vaginal bleeding: Secondary | ICD-10-CM | POA: Insufficient documentation

## 2018-05-09 DIAGNOSIS — J45998 Other asthma: Secondary | ICD-10-CM

## 2018-05-09 DIAGNOSIS — J45909 Unspecified asthma, uncomplicated: Secondary | ICD-10-CM | POA: Insufficient documentation

## 2018-05-09 DIAGNOSIS — N939 Abnormal uterine and vaginal bleeding, unspecified: Secondary | ICD-10-CM

## 2018-05-09 DIAGNOSIS — M5431 Sciatica, right side: Secondary | ICD-10-CM | POA: Insufficient documentation

## 2018-05-09 DIAGNOSIS — B9689 Other specified bacterial agents as the cause of diseases classified elsewhere: Secondary | ICD-10-CM | POA: Insufficient documentation

## 2018-05-09 DIAGNOSIS — D259 Leiomyoma of uterus, unspecified: Secondary | ICD-10-CM | POA: Insufficient documentation

## 2018-05-09 LAB — POCT PREGNANCY, URINE: Preg Test, Ur: NEGATIVE

## 2018-05-09 LAB — CBC
HCT: 26.8 % — ABNORMAL LOW (ref 36.0–46.0)
Hemoglobin: 8.2 g/dL — ABNORMAL LOW (ref 12.0–15.0)
MCH: 23 pg — AB (ref 26.0–34.0)
MCHC: 30.6 g/dL (ref 30.0–36.0)
MCV: 75.3 fL — ABNORMAL LOW (ref 78.0–100.0)
PLATELETS: 438 10*3/uL — AB (ref 150–400)
RBC: 3.56 MIL/uL — AB (ref 3.87–5.11)
RDW: 17.6 % — ABNORMAL HIGH (ref 11.5–15.5)
WBC: 7.3 10*3/uL (ref 4.0–10.5)

## 2018-05-09 LAB — URINALYSIS, ROUTINE W REFLEX MICROSCOPIC
BACTERIA UA: NONE SEEN
BILIRUBIN URINE: NEGATIVE
Glucose, UA: NEGATIVE mg/dL
KETONES UR: 5 mg/dL — AB
LEUKOCYTES UA: NEGATIVE
Nitrite: NEGATIVE
Protein, ur: NEGATIVE mg/dL
Specific Gravity, Urine: 1.029 (ref 1.005–1.030)
pH: 5 (ref 5.0–8.0)

## 2018-05-09 LAB — WET PREP, GENITAL
Sperm: NONE SEEN
Trich, Wet Prep: NONE SEEN
Yeast Wet Prep HPF POC: NONE SEEN

## 2018-05-09 MED ORDER — FLUTICASONE PROPIONATE HFA 110 MCG/ACT IN AERO
2.0000 | INHALATION_SPRAY | Freq: Two times a day (BID) | RESPIRATORY_TRACT | Status: DC
  Administered 2018-05-09: 2 via RESPIRATORY_TRACT
  Filled 2018-05-09: qty 12

## 2018-05-09 MED ORDER — MEGESTROL ACETATE 40 MG PO TABS: 40.0000 mg | ORAL_TABLET | Freq: Two times a day (BID) | ORAL | 11 refills | Status: AC

## 2018-05-09 MED ORDER — FERROUS SULFATE 325 (65 FE) MG PO TABS: 325.0000 mg | ORAL_TABLET | Freq: Two times a day (BID) | ORAL | 1 refills | Status: DC

## 2018-05-09 MED ORDER — ALBUTEROL SULFATE HFA 108 (90 BASE) MCG/ACT IN AERS: 2.0000 | INHALATION_SPRAY | RESPIRATORY_TRACT | 2 refills | Status: AC | PRN

## 2018-05-09 MED ORDER — ALBUTEROL SULFATE HFA 108 (90 BASE) MCG/ACT IN AERS: 2.0000 | INHALATION_SPRAY | RESPIRATORY_TRACT | 2 refills | Status: DC | PRN

## 2018-05-09 MED ORDER — METRONIDAZOLE 500 MG PO TABS: 500.0000 mg | ORAL_TABLET | Freq: Two times a day (BID) | ORAL | 0 refills | Status: AC

## 2018-05-09 MED ORDER — FLUTICASONE PROPIONATE HFA 110 MCG/ACT IN AERO: 2.0000 | INHALATION_SPRAY | Freq: Two times a day (BID) | RESPIRATORY_TRACT | 12 refills | Status: AC

## 2018-05-09 MED ORDER — KETOROLAC TROMETHAMINE 60 MG/2ML IM SOLN
60.0000 mg | Freq: Once | INTRAMUSCULAR | Status: AC
  Administered 2018-05-09: 60 mg via INTRAMUSCULAR
  Filled 2018-05-09: qty 2

## 2018-05-09 MED ORDER — MEGESTROL ACETATE 40 MG PO TABS
120.0000 mg | ORAL_TABLET | Freq: Once | ORAL | Status: AC
  Administered 2018-05-09: 120 mg via ORAL
  Filled 2018-05-09: qty 3

## 2018-05-09 NOTE — MAU Note (Signed)
Pt repots cervical pain and back pain related to fibroids. Vaginal bleeding since Friday. Pt has not taken anything for pain. Pt was concerned about her results from 2 years ago and wants to know the results.

## 2018-05-09 NOTE — MAU Provider Note (Addendum)
Chief Complaint: Vaginal Bleeding   First Provider Initiated Contact with Patient 05/09/18 0309      SUBJECTIVE HPI: Heather Graham is a 42 y.o. G2P2 at Unknown by LMP who presents to maternity admissions reporting heavy vaginal bleeding monthly with menses.  Bleeding is heavy, dark red with clots. She reports the bleeding is associated with sharp intermittent abdominal cramping and occasional dizziness with headache.  She reports recent vaginal discharge with odor.   She is using her asthma inhaler daily and is often short of breath.  She also reports shooting pain down her right leg often, especially when standing.   She takes Tylenol for pain which does not help. There are no other symptoms.     HPI  Past Medical History:  Diagnosis Date  . Anemia   . Asthma   . Back pain   . GERD (gastroesophageal reflux disease)   . Irregular heart beat    Past Surgical History:  Procedure Laterality Date  . TUBAL LIGATION     Social History   Socioeconomic History  . Marital status: Divorced    Spouse name: Not on file  . Number of children: Not on file  . Years of education: Not on file  . Highest education level: Not on file  Occupational History  . Not on file  Social Needs  . Financial resource strain: Not on file  . Food insecurity:    Worry: Not on file    Inability: Not on file  . Transportation needs:    Medical: Not on file    Non-medical: Not on file  Tobacco Use  . Smoking status: Never Smoker  . Smokeless tobacco: Never Used  Substance and Sexual Activity  . Alcohol use: Yes  . Drug use: No  . Sexual activity: Yes    Birth control/protection: Surgical  Lifestyle  . Physical activity:    Days per week: Not on file    Minutes per session: Not on file  . Stress: Not on file  Relationships  . Social connections:    Talks on phone: Not on file    Gets together: Not on file    Attends religious service: Not on file    Active member of club or organization:  Not on file    Attends meetings of clubs or organizations: Not on file    Relationship status: Not on file  . Intimate partner violence:    Fear of current or ex partner: Not on file    Emotionally abused: Not on file    Physically abused: Not on file    Forced sexual activity: Not on file  Other Topics Concern  . Not on file  Social History Narrative  . Not on file   No current facility-administered medications on file prior to encounter.    Current Outpatient Medications on File Prior to Encounter  Medication Sig Dispense Refill  . benzonatate (TESSALON) 100 MG capsule Take 1 capsule (100 mg total) by mouth every 8 (eight) hours. 21 capsule 0  . guaiFENesin (ROBITUSSIN) 100 MG/5ML liquid Take 200 mg by mouth 3 (three) times daily as needed for cough.    . Multiple Vitamin (MULTIVITAMIN) tablet Take 1 tablet by mouth daily.    . predniSONE (STERAPRED UNI-PAK 21 TAB) 10 MG (21) TBPK tablet Take 6 tabs day 1, 5 tabs day 2, 4 tabs day 3, 3 tabs day 4, 2 tabs day 5, and 1 tab on day 6. (Patient not taking: Reported on  09/29/2016) 21 tablet 0   Allergies  Allergen Reactions  . Codeine Other (See Comments)    Made very sleepy  . Penicillins Hives    Has patient had a PCN reaction causing immediate rash, facial/tongue/throat swelling, SOB or lightheadedness with hypotension: Yes Has patient had a PCN reaction causing severe rash involving mucus membranes or skin necrosis: Yes Has patient had a PCN reaction that required hospitalization No Has patient had a PCN reaction occurring within the last 10 years: No If all of the above answers are "NO", then may proceed with Cephalosporin use.   . Tramadol Hives    ROS:  Review of Systems  Constitutional: Negative for chills, fatigue and fever.  Respiratory: Positive for shortness of breath.   Cardiovascular: Negative for chest pain.  Gastrointestinal: Positive for abdominal pain.  Genitourinary: Positive for pelvic pain and vaginal  bleeding. Negative for difficulty urinating, dysuria, flank pain, vaginal discharge and vaginal pain.  Neurological: Negative for dizziness and headaches.  Psychiatric/Behavioral: Negative.      I have reviewed patient's Past Medical Hx, Surgical Hx, Family Hx, Social Hx, medications and allergies.   Physical Exam   Patient Vitals for the past 24 hrs:  BP Temp Pulse Resp Height Weight  05/09/18 0229 (!) 115/99 98.2 F (36.8 C) (!) 111 18 5\' 6"  (1.676 m) (!) 147 kg   Constitutional: Well-developed, well-nourished female in no acute distress.  Cardiovascular: normal rate Respiratory: normal effort GI: Abd soft, non-tender. Pos BS x 4 MS: Extremities nontender, no edema, normal ROM Neurologic: Alert and oriented x 4.  GU: Neg CVAT.  PELVIC EXAM: Cervix pink, visually closed, without lesion, small amount dark red bleeding not requiring a fox swab, vaginal walls and external genitalia normal Bimanual exam: Cervix 0/long/high, firm, anterior, neg CMT, uterus mildly tender, slightly enlarged, adnexa without tenderness, enlargement, or mass   LAB RESULTS Results for orders placed or performed during the hospital encounter of 05/09/18 (from the past 24 hour(s))  Urinalysis, Routine w reflex microscopic     Status: Abnormal   Collection Time: 05/09/18  2:40 AM  Result Value Ref Range   Color, Urine YELLOW YELLOW   APPearance HAZY (A) CLEAR   Specific Gravity, Urine 1.029 1.005 - 1.030   pH 5.0 5.0 - 8.0   Glucose, UA NEGATIVE NEGATIVE mg/dL   Hgb urine dipstick MODERATE (A) NEGATIVE   Bilirubin Urine NEGATIVE NEGATIVE   Ketones, ur 5 (A) NEGATIVE mg/dL   Protein, ur NEGATIVE NEGATIVE mg/dL   Nitrite NEGATIVE NEGATIVE   Leukocytes, UA NEGATIVE NEGATIVE   RBC / HPF 0-5 0 - 5 RBC/hpf   WBC, UA 0-5 0 - 5 WBC/hpf   Bacteria, UA NONE SEEN NONE SEEN   Squamous Epithelial / LPF 6-10 0 - 5   Mucus PRESENT   Pregnancy, urine POC     Status: None   Collection Time: 05/09/18  3:09 AM   Result Value Ref Range   Preg Test, Ur NEGATIVE NEGATIVE  CBC     Status: Abnormal   Collection Time: 05/09/18  3:45 AM  Result Value Ref Range   WBC 7.3 4.0 - 10.5 K/uL   RBC 3.56 (L) 3.87 - 5.11 MIL/uL   Hemoglobin 8.2 (L) 12.0 - 15.0 g/dL   HCT 26.8 (L) 36.0 - 46.0 %   MCV 75.3 (L) 78.0 - 100.0 fL   MCH 23.0 (L) 26.0 - 34.0 pg   MCHC 30.6 30.0 - 36.0 g/dL   RDW 17.6 (H) 11.5 -  15.5 %   Platelets 438 (H) 150 - 400 K/uL  Wet prep, genital     Status: Abnormal   Collection Time: 05/09/18  4:00 AM  Result Value Ref Range   Yeast Wet Prep HPF POC NONE SEEN NONE SEEN   Trich, Wet Prep NONE SEEN NONE SEEN   Clue Cells Wet Prep HPF POC PRESENT (A) NONE SEEN   WBC, Wet Prep HPF POC MANY (A) NONE SEEN   Sperm NONE SEEN        IMAGING No results found.  MAU Management/MDM: UA, CBC, wet prep, GC. CBC with Hgb 8.2 so with anemia due to blood loss. Symptoms minimal so plan to treat with PO iron and Megace to stop AUB with close outpatient follow up.  Pt with mild intermittent asthma symptoms. Flovent inhaler with first dose given in MAU then BID.Teaching done about sciatica, exercises/positions to try.  Pt to f/u with primary care.  Message sent to [facility] for follow up.  Pt discharged with strict bleeding/return precautions.  ASSESSMENT 1. Abnormal uterine bleeding (AUB)   2. Poorly controlled persistent asthma   3. Uterine leiomyoma, unspecified location   4. Sciatica of right side   5. BV (bacterial vaginosis)     PLAN Discharge home Allergies as of 05/09/2018      Reactions   Codeine Other (See Comments)   Made very sleepy   Penicillins Hives   Has patient had a PCN reaction causing immediate rash, facial/tongue/throat swelling, SOB or lightheadedness with hypotension: Yes Has patient had a PCN reaction causing severe rash involving mucus membranes or skin necrosis: Yes Has patient had a PCN reaction that required hospitalization No Has patient had a PCN reaction occurring  within the last 10 years: No If all of the above answers are "NO", then may proceed with Cephalosporin use.   Tramadol Hives      Medication List    STOP taking these medications   benzonatate 100 MG capsule Commonly known as:  TESSALON   guaiFENesin 100 MG/5ML liquid Commonly known as:  ROBITUSSIN   predniSONE 10 MG (21) Tbpk tablet Commonly known as:  STERAPRED UNI-PAK 21 TAB     TAKE these medications   albuterol 108 (90 Base) MCG/ACT inhaler Commonly known as:  PROVENTIL HFA;VENTOLIN HFA Inhale 2 puffs into the lungs every 4 (four) hours as needed for wheezing or shortness of breath.   ferrous sulfate 325 (65 FE) MG tablet Take 1 tablet (325 mg total) by mouth 2 (two) times daily.   fluticasone 110 MCG/ACT inhaler Commonly known as:  FLOVENT HFA Inhale 2 puffs into the lungs 2 (two) times daily.   megestrol 40 MG tablet Commonly known as:  MEGACE Take 1 tablet (40 mg total) by mouth 2 (two) times daily. What changed:  when to take this   metroNIDAZOLE 500 MG tablet Commonly known as:  FLAGYL Take 1 tablet (500 mg total) by mouth 2 (two) times daily.   multivitamin tablet Take 1 tablet by mouth daily.        Fatima Blank Certified Nurse-Midwife 05/09/2018  5:30 AM

## 2018-05-09 NOTE — Discharge Instructions (Signed)
Try GoodRx.com or GoodRx app on smart phone to save money on prescriptions.

## 2018-05-09 NOTE — MAU Note (Signed)
Hx fibroids. Having vaginal pain and some vaginal bleeding for 2 days. Has used 2 pampers for bleeding

## 2018-05-10 LAB — GC/CHLAMYDIA PROBE AMP (~~LOC~~) NOT AT ARMC
Chlamydia: NEGATIVE
Neisseria Gonorrhea: NEGATIVE

## 2018-05-16 ENCOUNTER — Emergency Department (HOSPITAL_BASED_OUTPATIENT_CLINIC_OR_DEPARTMENT_OTHER)
Admission: EM | Admit: 2018-05-16 | Discharge: 2018-05-16 | Disposition: A | Discharge status: Home / Self Care | Payer: Self-pay | Attending: Emergency Medicine | Admitting: Emergency Medicine

## 2018-05-16 ENCOUNTER — Other Ambulatory Visit: Payer: Self-pay

## 2018-05-16 ENCOUNTER — Encounter (HOSPITAL_BASED_OUTPATIENT_CLINIC_OR_DEPARTMENT_OTHER): Payer: Self-pay | Admitting: Emergency Medicine

## 2018-05-16 DIAGNOSIS — J4521 Mild intermittent asthma with (acute) exacerbation: Secondary | ICD-10-CM | POA: Insufficient documentation

## 2018-05-16 DIAGNOSIS — M5431 Sciatica, right side: Secondary | ICD-10-CM | POA: Insufficient documentation

## 2018-05-16 DIAGNOSIS — Z79899 Other long term (current) drug therapy: Secondary | ICD-10-CM | POA: Insufficient documentation

## 2018-05-16 LAB — URINALYSIS, ROUTINE W REFLEX MICROSCOPIC
BILIRUBIN URINE: NEGATIVE
Glucose, UA: NEGATIVE mg/dL
HGB URINE DIPSTICK: NEGATIVE
KETONES UR: NEGATIVE mg/dL
Leukocytes, UA: NEGATIVE
NITRITE: NEGATIVE
Protein, ur: NEGATIVE mg/dL
pH: 6 (ref 5.0–8.0)

## 2018-05-16 LAB — PREGNANCY, URINE: Preg Test, Ur: NEGATIVE

## 2018-05-16 MED ORDER — ACETAMINOPHEN 325 MG PO TABS
650.0000 mg | ORAL_TABLET | Freq: Once | ORAL | Status: AC
  Administered 2018-05-16: 650 mg via ORAL
  Filled 2018-05-16: qty 2

## 2018-05-16 MED ORDER — KETOROLAC TROMETHAMINE 30 MG/ML IJ SOLN
30.0000 mg | Freq: Once | INTRAMUSCULAR | Status: AC
  Administered 2018-05-16: 30 mg via INTRAMUSCULAR
  Filled 2018-05-16: qty 1

## 2018-05-16 MED ORDER — PREDNISONE 10 MG (21) PO TBPK: ORAL_TABLET | Freq: Every day | ORAL | 0 refills | Status: AC

## 2018-05-16 MED ORDER — PREDNISONE 50 MG PO TABS
60.0000 mg | ORAL_TABLET | Freq: Once | ORAL | Status: AC
  Administered 2018-05-16: 60 mg via ORAL
  Filled 2018-05-16: qty 1

## 2018-05-16 NOTE — ED Notes (Signed)
States," I am hurting all over and my legs are swelling"

## 2018-05-16 NOTE — ED Provider Notes (Signed)
[facility] EMERGENCY DEPARTMENT Provider Note   CSN: [id number] Arrival date & time: 05/16/18  1044     History   Chief Complaint Chief Complaint  Patient presents with  . Back Pain    HPI Heather Graham is a 42 y.o. female with a past medical history of asthma, fibroids who presents to ED for multiple complaints. It is difficult to obtain a history from patient as she is telling me about multiple complaints and giving multiple stories.   Her first complaint is a flareup of her right sided sciatica.  Symptoms worsen today when she was walking from one hotel to the other.  Patient is currently residing in [place] but is in [place] for a court case.  States that steroids have helped her in the past.  States that the pain is sharp, radiating down the back of her right leg.  Denies any injuries or falls, numbness in arms or legs, loss of bowel or bladder function or prior back surgeries. Her next complaint is asthma exacerbation.  She reports intermittent cough and wheezing that is improved with her inhaler.  Denies any chest pain, hemoptysis or shortness of breath. She also complains of bilateral leg swelling, pelvic pain from her uterine fibroids, and vaginal bleeding.  She is being followed by her primary care provider and OB/GYN at the [facility] for this.  She denies any current worsening of the symptoms.  States that in the past, steroids have help with her leg swelling.  She does not wear compression stockings.   HPI  Past Medical History:  Diagnosis Date  . Anemia   . Asthma   . Back pain   . GERD (gastroesophageal reflux disease)   . Irregular heart beat     There are no active problems to display for this patient.   Past Surgical History:  Procedure Laterality Date  . TUBAL LIGATION       OB History    Gravida  2   Para  2   Term      Preterm      AB      Living  2     SAB      TAB      Ectopic      Multiple      Live  Births               Home Medications    Prior to Admission medications   Medication Sig Start Date End Date Taking? Authorizing Provider  albuterol (PROVENTIL HFA;VENTOLIN HFA) 108 (90 Base) MCG/ACT inhaler Inhale 2 puffs into the lungs every 4 (four) hours as needed for wheezing or shortness of breath. 05/09/18   Leftwich-Kirby, Kathie Dike, CNM  ferrous sulfate (FERROUSUL) 325 (65 FE) MG tablet Take 1 tablet (325 mg total) by mouth 2 (two) times daily. 05/09/18   Leftwich-Kirby, Kathie Dike, CNM  fluticasone (FLOVENT HFA) 110 MCG/ACT inhaler Inhale 2 puffs into the lungs 2 (two) times daily. 05/09/18   Leftwich-Kirby, Kathie Dike, CNM  megestrol (MEGACE) 40 MG tablet Take 1 tablet (40 mg total) by mouth 2 (two) times daily. 05/09/18   Leftwich-Kirby, Kathie Dike, CNM  metroNIDAZOLE (FLAGYL) 500 MG tablet Take 1 tablet (500 mg total) by mouth 2 (two) times daily. 05/09/18   Leftwich-Kirby, Kathie Dike, CNM  Multiple Vitamin (MULTIVITAMIN) tablet Take 1 tablet by mouth daily.    [provider]  predniSONE (STERAPRED UNI-PAK 21 TAB) 10 MG (21) TBPK tablet Take  by mouth daily. Take 6 tabs by mouth daily  for 1 days, then 5 tabs for 2 days, then 4 tabs for 2 days, then 3 tabs for 2 days, 2 tabs for 2 days, then 1 tab by mouth daily for 2 days 05/16/18   Delia Heady, PA-C    Family History History reviewed. No pertinent family history.  Social History Social History   Tobacco Use  . Smoking status: Never Smoker  . Smokeless tobacco: Never Used  Substance Use Topics  . Alcohol use: Yes  . Drug use: No     Allergies   Codeine; Penicillins; and Tramadol   Review of Systems Review of Systems  Constitutional: Negative for appetite change, chills and fever.  HENT: Negative for ear pain, rhinorrhea, sneezing and sore throat.   Eyes: Negative for photophobia and visual disturbance.  Respiratory: Positive for cough and wheezing. Negative for chest tightness and shortness of breath.   Cardiovascular:  Positive for leg swelling. Negative for chest pain and palpitations.  Gastrointestinal: Negative for abdominal pain, blood in stool, constipation, diarrhea, nausea and vomiting.  Genitourinary: Negative for dysuria, hematuria and urgency.  Musculoskeletal: Positive for back pain. Negative for myalgias.  Skin: Negative for rash.  Neurological: Negative for dizziness, weakness and light-headedness.     Physical Exam Updated Vital Signs BP (!) 130/96   Pulse 94   Temp 98.8 F (37.1 C) (Oral)   Resp 18   Ht 5\' 6"  (1.676 m)   Wt (!) 147 kg   SpO2 100%   BMI 52.31 kg/m   Physical Exam  Constitutional: She appears well-developed and well-nourished. No distress.  HENT:  Head: Normocephalic and atraumatic.  Nose: Nose normal.  Eyes: Conjunctivae and EOM are normal. Right eye exhibits no discharge. Left eye exhibits no discharge. No scleral icterus.  Neck: Normal range of motion. Neck supple.  Cardiovascular: Normal rate, regular rhythm, normal heart sounds and intact distal pulses. Exam reveals no gallop and no friction rub.  No murmur heard. Pulmonary/Chest: Effort normal and breath sounds normal. No respiratory distress.  No wheezing noted on exam.  Abdominal: Soft. Bowel sounds are normal. She exhibits no distension. There is no tenderness. There is no guarding.  Musculoskeletal: Normal range of motion. She exhibits edema.  1+ pitting, bilateral lower extremity edema.  No calf tenderness bilaterally.  Neurological: She is alert. She exhibits normal muscle tone. Coordination normal.  Skin: Skin is warm and dry. No rash noted.  Psychiatric: She has a normal mood and affect.  Nursing note and vitals reviewed.    ED Treatments / Results  Labs (all labs ordered are listed, but only abnormal results are displayed) Labs Reviewed  URINALYSIS, ROUTINE W REFLEX MICROSCOPIC - Abnormal; Notable for the following components:      Result Value   Specific Gravity, Urine >1.030 (*)    All  other components within normal limits  PREGNANCY, URINE    EKG None  Radiology No results found.  Procedures Procedures (including critical care time)  Medications Ordered in ED Medications  predniSONE (DELTASONE) tablet 60 mg (has no administration in time range)     Initial Impression / Assessment and Plan / ED Course  I have reviewed the triage vital signs and the nursing notes.  Pertinent labs & imaging results that were available during my care of the patient were reviewed by me and considered in my medical decision making (see chart for details).     42 year old female presents for multiple complaints.  Her main complaint today is a flareup of her sciatica on the right side and asthma exacerbation.  No wheezing noted on my exam.  No neurological deficits noted on exam of bilateral lower extremities.  No midline tenderness noted.  No abdominal tenderness to palpation noted.  No calf tenderness that would concern me for DVT.  Patient states that steroids have helped her sciatica and asthma symptoms in the past.  She Artie has an inhaler.  She also mention to me bilateral leg swelling, uterine fibroid pain that is being followed by her primary care provider and OB/GYN.  Do not feel that any work-up is further warranted for these complaints.  Her main complaint today is the sciatica and asthma exacerbation will give a course of steroids and refer to PCP for further evaluation.  At this time I doubt cardiac cause of her back pain and respiratory symptoms.  Advised to return to ED for any severe worsening symptoms.  Portions of this note were generated with [employer]. Dictation errors may occur despite best attempts at proofreading.   Final Clinical Impressions(s) / ED Diagnoses   Final diagnoses:  Sciatica of right side  Mild intermittent asthma with exacerbation    ED Discharge Orders         Ordered    predniSONE (STERAPRED UNI-PAK 21 TAB) 10 MG (21) TBPK  tablet  Daily     05/16/18 1206           Delia Heady, PA-C 05/16/18 1206    Jola Schmidt, MD 05/16/18 1406

## 2018-05-16 NOTE — Discharge Instructions (Addendum)
Return to ED for worsening symptoms, trouble breathing or trouble swallowing, numbness in arms or legs, injuries or falls.

## 2018-05-16 NOTE — Care Management (Signed)
ED CM consulted concerning assistance with establishing primary care. Patient is uninsured.  Discussed [facility]. Patient [place] discussed with Claiborne Billings. Patient information sent to [facility] [place] and Patient instructed to contact the clinic tomorrow to schedule a ED f/u and to establish care. ED CM will place a referral via in-basket. Information was all placed on the AVS. No further ED CM needs Identified

## 2018-05-16 NOTE — ED Notes (Signed)
Attempted to discharge patient.  States she would like to speak to provider.  EDP made aware.

## 2018-05-16 NOTE — ED Triage Notes (Signed)
Patient states that she is having pain to her right back and has pain to her pelvic region. Patient has peripheral edema

## 2018-05-16 NOTE — ED Notes (Signed)
ED Provider at bedside. 

## 2018-05-22 ENCOUNTER — Encounter (HOSPITAL_COMMUNITY): Payer: Self-pay | Admitting: *Deleted

## 2018-05-22 ENCOUNTER — Inpatient Hospital Stay (HOSPITAL_COMMUNITY)
Admission: AD | Admit: 2018-05-22 | Discharge: 2018-05-22 | Disposition: A | Discharge status: Home / Self Care | Payer: Self-pay | Attending: Obstetrics & Gynecology | Admitting: Obstetrics & Gynecology

## 2018-05-22 DIAGNOSIS — Z888 Allergy status to other drugs, medicaments and biological substances status: Secondary | ICD-10-CM | POA: Insufficient documentation

## 2018-05-22 DIAGNOSIS — J4530 Mild persistent asthma, uncomplicated: Secondary | ICD-10-CM | POA: Insufficient documentation

## 2018-05-22 DIAGNOSIS — Z885 Allergy status to narcotic agent status: Secondary | ICD-10-CM | POA: Insufficient documentation

## 2018-05-22 DIAGNOSIS — Z79899 Other long term (current) drug therapy: Secondary | ICD-10-CM | POA: Insufficient documentation

## 2018-05-22 DIAGNOSIS — Z880 Allergy status to penicillin: Secondary | ICD-10-CM | POA: Insufficient documentation

## 2018-05-22 MED ORDER — ALBUTEROL SULFATE HFA 108 (90 BASE) MCG/ACT IN AERS
2.0000 | INHALATION_SPRAY | Freq: Once | RESPIRATORY_TRACT | Status: AC
  Administered 2018-05-22: 2 via RESPIRATORY_TRACT
  Filled 2018-05-22: qty 6.7

## 2018-05-22 MED ORDER — FLUCONAZOLE 200 MG PO TABS
200.0000 mg | ORAL_TABLET | Freq: Once | ORAL | Status: AC
  Administered 2018-05-22: 200 mg via ORAL
  Filled 2018-05-22: qty 1

## 2018-05-22 NOTE — MAU Note (Addendum)
Pt has hx of asthma. Was seen here recently and states cannot afford inhaler prescribed. States Albuterol works better but is not lasting long enough cause is having to use it more often. Walking is especially bad and causes SOB. Pt very talkative in Triage about a movie she saw last night. Did not have any trouble breathing while talking about movie

## 2018-05-22 NOTE — MAU Note (Signed)
Asking pt about medications and have to wake pt up to answer questions. States she finished up the inhalers she received and has not filled other prescriptions because she cannot afford them

## 2018-05-22 NOTE — Progress Notes (Addendum)
Written and verbal d/c instructions given and understanding voiced. Pt has albuterol inhaler from our pharmacy and understands her Flagyl script is at [facility] on [facility] and she can go there to obtain that. Has info regarding resources available in [facility] for PCP, help with meds, transportation, etc.

## 2018-05-22 NOTE — Discharge Instructions (Signed)
[place] (Revised August 2014)    Insufficient Money for Medicine:           [address]: call "211"    MAP Program at [place] or HP [phone]            No Primary Care Doctor:  To locate a primary care doctor that accepts your insurance or provides certain services:           [place]: [phone]           Physician Referral Service: [phone] ask for My [place]  If no insurance, you need to see if you qualify for [facility] orange card, call to set      up appointment for eligibility/enrollment at [phone] or [phone] or visit [place] ([address], [place] and [place]) to meet with a [facility] enrollment specialist.  Agencies that provide inexpensive (sliding fee scale) medical care:       [facility] - Family Medicine at [place] - [phone]     [facility] and [place] - [facility] - [place] [phone]     [facility] for Children - [place] [phone]  [facility] - [place] @ [place] [phone]  [facility] - [place] @ [place] - [phone]  [facility]: [phone]   Women's Clinic: [phone]   Planned Parenthood: [phone]   [facility] of the [place]    [place] Providers:           [facility] [phone] (No Family Planning accepted)          [address], [phone], Mon-Fri 9am-5pm          [facility] - [phone]  [place], [address] [place], Mon-Thursday 8am-5pm, Fri 8am-noon  [employer] -  [place], [address], Mon-Fri 7:30am-4:30pm          [place] - [phone]          [address], [facility] - [phone] [address], [address]          Only accepts [place] [employer] patients after they have their name applied to their card  Self Pay (no insurance) in [facility]:           Sickle Cell Patients:   [place], [phone] [facility]:  [address] [place] [phone]       [facility]  [address] [place] [phone]  [place]:  [address] [phone]          [facility]           [place], [phone] [facility] for [place], [phone]           [facility] [place]           [place], [address], [place] [address]           [phone], Mon-Fri 9am-7pm, [place] 9am-1pm          [facility] - Family Medicine @ [place], [place]          [facility] - [facility]           [address] [place] [facility] Adult and [place]  [address] [place] [phone]          [place], [place]  [facility] - [place]   [place], [place] [phone] [facility] and [place]  [address] [phone]  Dr. Vista Lawman           [address], [place], [facility]           [address] [phone]          [facility]             [address]  [phone]          [facility]           [place], [place], 1st & 3rd Saturday every month, 10am-1pm OTHERS:  [facility]  ([facility] Only)  [phone] (Thursday only) Strategies for finding a Primary Care Provider:  1) Find a Doctor and Pay Out of Pocket  Although you won't have to find out who is covered by your insurance plan, it is a good idea to ask around and get recommendations. You will then need to call the office and see if the doctor you have chosen will accept you as a new patient and what types of options they offer for patients who are self-pay. Some doctors offer discounts or will set up payment plans for their patients who do not have insurance, but you will need to ask so you aren't surprised when you get to your appointment.  2) [place] - To see if you qualify for orange card access to healthcare safety net providers.  Call for appointment for eligibility/enrollment at [phone] or [phone]. (Uninsured, 0-200% FPL, qualifying info)  Applicants for [facility] are first required to see if they are eligible to enroll in the [facility] Marketplace before enrolling in [facility] (and get an exemption if they are not).  GCCN Criteria for acceptance is:  ? Proof of ACA Marketing exemption - form or documentation  ? Valid photo ID (driver's license, state identification card, passport, home country ID)  ? Proof of [facility] residency (e.g. drivers license, lease/landlord information, pay stubs with address, utility bill, bank statement, etc.)  ? Proof of income (1040, last year's tax return, W2, 4 current pay stubs, other income proof)  ? Proof of assets (current bank statement +  3 most recent, disability paperwork, life insurance info, tax value on autos, etc.)  3) [place] Department  Not all health departments have doctors that can see patients for sick visits, but many do, so it is worth a call  to see if yours does. If you don't know where your local health department is, you can check in your phone book. The CDC also has a tool to help you locate your state's health department, and many state websites also have listings of all of their local health departments.  4) Find a [place] [facility]  If your illness is not likely to be very severe or complicated, you may want to try a walk in clinic. These are popping up all over the country in pharmacies, drugstores, and shopping centers. They're usually staffed by nurse practitioners or physician assistants that have been trained to treat common illnesses and complaints. They're usually fairly quick and inexpensive. However, if you have serious medical issues or chronic medical problems, these are probably not your best option   STD Testing:           [place], [place] [facility]           [address] [place], phone [phone] or [phone]           Monday - Friday, call for an appointment          [place], [place] [facility]           [address], [place], phone [phone] or [phone]           Monday - Friday, call for an appointment Abuse/Neglect:           [place]: [place]: [phone] (After Hours) Emergency Shelter:  [facility] [phone]  Ages- [phone]  [place] - [phone]  Youth Focus - Act Together - [phone] (ages 32-17)  [place] @ [employer] - [phone]   Mammograms - Free at [facility] [place]:           Room at the [place]: [phone]   (Homeless mother with children)          [place]: [phone] (Mothers only)  Youth Focus: [phone] (Pregnant 3-21 years old)  Adopt a Mom  [phone]  [facility]   [facility] and [place]  [address] [place] [phone]          [facility] [place]           [address], [place], [place]          [facility] Dept.           [place], [place]  [place] [facility]           [phone]          [facility] in [place]           [phone]           [phone] (After Hours)  [place] Abuse Resources:           Alcohol and Drug Services: [phone]           [facility]: [phone]          [facility]: [phone]   Narcotics Helpline - [phone]          [facility]: [phone]           Residential & Outpatient Substance Abuse Program - [place]: [phone]  [employer]  [place] and [place] - [phone] Psychological Services:          [place]: [phone]   Therapeutic Alternatives: [phone]          [place]           [address]: [phone]     (24 Hour)  Mobile Crisis:   [place]:  Radio producer on [place] [phone] [facility] on [place] [phone]  Walk In [place]  ([place] - [phone] or [phone]  [place]. [place] [phone]  [place] [address] [place] [phone]   Dental Assistance:  If unable to pay or uninsured, contact: [facility] to become qualified for the adult dental clinic. Patient must be enrolled in [facility] (uninsured, 0-200% FPL, qualifying info).  Enroll in [facility] first, then see Primary Care Physician assigned to you, the PCP makes a dental referral. [place] [facility] will receive referral and contacts patient for appointment.  Patients with Medicaid           [address], [place] (Children up to 80 + Pregnant Women) - [phone]  [place] - Suite [phone]  If unable to pay, or uninsured: contact [place] [phone] in [place] - ([place] only + Pregnant Women), [phone] in [place] only) to become qualified for the adult dental clinic  Must see if eligible to enroll in [place] before enrolling into the [facility] (exemption required) [phone] for an appointment)  [email or web address];   [phone].  If not eligible for ACA, then go by Department of Health and Human Services to see if eligible for orange card.  [address], [place].  Once you get an orange card, you will have a Primary Care home who will then refer you to dental if needed.  [address]           [facility]           [address], [place], [place], [address]           [phone], Ext. [phone]           2nd and 4th Thursday of the month at 6:30am (Simple extractions only - no wisdom teeth or surgery) First come/First serve -First 10 clients served           [facility] ([place], [place] and [place] residents only)          [address], [place], [place], [address]            [phone]                    [facility]           [phone]          [place] [facility]          [phone]         [place] [facility] - [facility]          [phone]   Transportation Options:  Ambulance - 911 - [phone] per ride Family Member to accompany patient (if stable) - [place] [employer] - [phone]  PART - [phone]  Taxi - [phone] - [place]  SCAT - [phone] (Application required)  [place] Mobility Services - [phone]  

## 2018-05-22 NOTE — Progress Notes (Signed)
Robyne Askew NP notified of pt's admission and status. Will see pt

## 2018-05-22 NOTE — MAU Provider Note (Signed)
Chief Complaint: Shortness of Breath   First Provider Initiated Contact with Patient 05/22/18 0606     SUBJECTIVE HPI: Heather Graham is a 42 y.o. non pregnant female who presents to Maternity Admissions reporting SOB & wheezing. Has hx of asthma. Seen in MAU & ED twice in the last week. Was sent home with albuterol & flovent inhalers but states she has run out of them since then b/c they were the small samples. Does not have a PCP. Was prescribed steroids by ED earlier this week for her asthma & back but didn't get rx filled b/c she "didn't know what pharmacy they sent it to". Didn't get inhalers filled d/t cost.  Reports current symptoms have been ongoing since 2017. States she has SOB & wheezing that is worse when she is walking around & that the only thing that helps her symptoms is her inhaler. No chest pain. No cough.    Past Medical History:  Diagnosis Date  . Anemia   . Asthma   . Back pain   . GERD (gastroesophageal reflux disease)   . Irregular heart beat    OB History  Gravida Para Term Preterm AB Living  2 2       2   SAB TAB Ectopic Multiple Live Births               # Outcome Date GA Lbr Len/2nd Weight Sex Delivery Anes PTL Lv  2 Para           1 Para            Past Surgical History:  Procedure Laterality Date  . TUBAL LIGATION     Social History   Socioeconomic History  . Marital status: Divorced    Spouse name: Not on file  . Number of children: Not on file  . Years of education: Not on file  . Highest education level: Not on file  Occupational History  . Not on file  Social Needs  . Financial resource strain: Not on file  . Food insecurity:    Worry: Not on file    Inability: Not on file  . Transportation needs:    Medical: Not on file    Non-medical: Not on file  Tobacco Use  . Smoking status: Never Smoker  . Smokeless tobacco: Never Used  Substance and Sexual Activity  . Alcohol use: Yes  . Drug use: No  . Sexual activity: Yes    Birth  control/protection: Surgical  Lifestyle  . Physical activity:    Days per week: Not on file    Minutes per session: Not on file  . Stress: Not on file  Relationships  . Social connections:    Talks on phone: Not on file    Gets together: Not on file    Attends religious service: Not on file    Active member of club or organization: Not on file    Attends meetings of clubs or organizations: Not on file    Relationship status: Not on file  . Intimate partner violence:    Fear of current or ex partner: Not on file    Emotionally abused: Not on file    Physically abused: Not on file    Forced sexual activity: Not on file  Other Topics Concern  . Not on file  Social History Narrative  . Not on file   History reviewed. No pertinent family history. No current facility-administered medications on file prior to encounter.  Current Outpatient Medications on File Prior to Encounter  Medication Sig Dispense Refill  . albuterol (PROVENTIL HFA;VENTOLIN HFA) 108 (90 Base) MCG/ACT inhaler Inhale 2 puffs into the lungs every 4 (four) hours as needed for wheezing or shortness of breath. 1 Inhaler 2  . megestrol (MEGACE) 40 MG tablet Take 1 tablet (40 mg total) by mouth 2 (two) times daily. 60 tablet 11  . ferrous sulfate (FERROUSUL) 325 (65 FE) MG tablet Take 1 tablet (325 mg total) by mouth 2 (two) times daily. 60 tablet 1  . fluticasone (FLOVENT HFA) 110 MCG/ACT inhaler Inhale 2 puffs into the lungs 2 (two) times daily. 1 Inhaler 12  . metroNIDAZOLE (FLAGYL) 500 MG tablet Take 1 tablet (500 mg total) by mouth 2 (two) times daily. 14 tablet 0  . Multiple Vitamin (MULTIVITAMIN) tablet Take 1 tablet by mouth daily.    . predniSONE (STERAPRED UNI-PAK 21 TAB) 10 MG (21) TBPK tablet Take by mouth daily. Take 6 tabs by mouth daily  for 1 days, then 5 tabs for 2 days, then 4 tabs for 2 days, then 3 tabs for 2 days, 2 tabs for 2 days, then 1 tab by mouth daily for 2 days 36 tablet 0   Allergies   Allergen Reactions  . Codeine Other (See Comments)    Made very sleepy  . Penicillins Hives    Has patient had a PCN reaction causing immediate rash, facial/tongue/throat swelling, SOB or lightheadedness with hypotension: Yes Has patient had a PCN reaction causing severe rash involving mucus membranes or skin necrosis: Yes Has patient had a PCN reaction that required hospitalization No Has patient had a PCN reaction occurring within the last 10 years: No If all of the above answers are "NO", then may proceed with Cephalosporin use.   . Tramadol Hives    I have reviewed patient's Past Medical Hx, Surgical Hx, Family Hx, Social Hx, medications and allergies.   Review of Systems  Constitutional: Negative.   Respiratory: Positive for shortness of breath and wheezing. Negative for cough.   Cardiovascular: Negative for chest pain.  Gastrointestinal: Negative.     OBJECTIVE Patient Vitals for the past 24 hrs:  BP Temp Pulse Resp SpO2 Height Weight  05/22/18 0647 140/76 - 72 18 100 % - -  05/22/18 0525 132/79 98 F (36.7 C) 88 18 100 % 5\' 6"  (1.676 m) (!) 146.1 kg   Constitutional: Well-developed, well-nourished female in no acute distress.  Cardiovascular: normal rate & rhythm, no murmur Respiratory: normal rate and effort. Lung sounds clear throughout GI: Abd soft, non-tender, Pos BS x 4. No guarding or rebound tenderness MS: Extremities nontender, no edema, normal ROM Neurologic: Alert and oriented x 4.   LAB RESULTS No results found for this or any previous visit (from the past 24 hour(s)).  IMAGING No results found.  MAU COURSE Orders Placed This Encounter  Procedures  . Discharge patient   Meds ordered this encounter  Medications  . fluconazole (DIFLUCAN) tablet 200 mg  . albuterol (PROVENTIL HFA;VENTOLIN HFA) 108 (90 Base) MCG/ACT inhaler 2 puff    MDM VSS. SpO2 100%. Lung sounds clear throughout. Patient in no apparent distress. Multiple ED visits for  management of asthma. Will give patient albuterol inhaler. Also given information for [place] resources so she can establish primary care. Pt knows to [facility] or [facility] for worsening of symptoms.   ASSESSMENT 1. Mild persistent asthma, unspecified whether complicated     PLAN Discharge home in  stable condition.  Follow-up Information    [place] Follow up.   Specialty:  Emergency Medicine Contact information: [address] [id number] [place] [phone]         Allergies as of 05/22/2018      Reactions   Codeine Other (See Comments)   Made very sleepy   Penicillins Hives   Has patient had a PCN reaction causing immediate rash, facial/tongue/throat swelling, SOB or lightheadedness with hypotension: Yes Has patient had a PCN reaction causing severe rash involving mucus membranes or skin necrosis: Yes Has patient had a PCN reaction that required hospitalization No Has patient had a PCN reaction occurring within the last 10 years: No If all of the above answers are "NO", then may proceed with Cephalosporin use.   Tramadol Hives      Medication List    TAKE these medications   albuterol 108 (90 Base) MCG/ACT inhaler Commonly known as:  PROVENTIL HFA;VENTOLIN HFA Inhale 2 puffs into the lungs every 4 (four) hours as needed for wheezing or shortness of breath.   ferrous sulfate 325 (65 FE) MG tablet Take 1 tablet (325 mg total) by mouth 2 (two) times daily.   fluticasone 110 MCG/ACT inhaler Commonly known as:  FLOVENT HFA Inhale 2 puffs into the lungs 2 (two) times daily.   megestrol 40 MG tablet Commonly known as:  MEGACE Take 1 tablet (40 mg total) by mouth 2 (two) times daily.   metroNIDAZOLE 500 MG tablet Commonly known as:  FLAGYL Take 1 tablet (500 mg total) by mouth 2 (two) times daily.   multivitamin tablet Take 1 tablet by mouth daily.   predniSONE 10 MG (21) Tbpk  tablet Commonly known as:  STERAPRED UNI-PAK 21 TAB Take by mouth daily. Take 6 tabs by mouth daily  for 1 days, then 5 tabs for 2 days, then 4 tabs for 2 days, then 3 tabs for 2 days, 2 tabs for 2 days, then 1 tab by mouth daily for 2 days        Jorje Guild, NP 05/22/2018  8:38 AM

## 2018-06-01 ENCOUNTER — Encounter (HOSPITAL_COMMUNITY): Payer: Self-pay

## 2018-06-01 ENCOUNTER — Emergency Department (HOSPITAL_COMMUNITY)
Admission: EM | Admit: 2018-06-01 | Discharge: 2018-06-01 | Disposition: A | Discharge status: Home / Self Care | Payer: Self-pay | Attending: Emergency Medicine | Admitting: Emergency Medicine

## 2018-06-01 ENCOUNTER — Inpatient Hospital Stay (HOSPITAL_COMMUNITY)
Admission: AD | Admit: 2018-06-01 | Discharge: 2018-06-01 | Disposition: A | Discharge status: Home / Self Care | Payer: Self-pay | Source: Ambulatory Visit | Attending: Obstetrics and Gynecology | Admitting: Obstetrics and Gynecology

## 2018-06-01 ENCOUNTER — Other Ambulatory Visit: Payer: Self-pay

## 2018-06-01 DIAGNOSIS — Z880 Allergy status to penicillin: Secondary | ICD-10-CM | POA: Insufficient documentation

## 2018-06-01 DIAGNOSIS — M5440 Lumbago with sciatica, unspecified side: Secondary | ICD-10-CM | POA: Insufficient documentation

## 2018-06-01 DIAGNOSIS — G8929 Other chronic pain: Secondary | ICD-10-CM | POA: Insufficient documentation

## 2018-06-01 DIAGNOSIS — Z79899 Other long term (current) drug therapy: Secondary | ICD-10-CM | POA: Insufficient documentation

## 2018-06-01 DIAGNOSIS — J4530 Mild persistent asthma, uncomplicated: Secondary | ICD-10-CM | POA: Insufficient documentation

## 2018-06-01 DIAGNOSIS — J45909 Unspecified asthma, uncomplicated: Secondary | ICD-10-CM | POA: Insufficient documentation

## 2018-06-01 LAB — I-STAT CHEM 8, ED
BUN: 10 mg/dL (ref 6–20)
CREATININE: 1 mg/dL (ref 0.44–1.00)
Calcium, Ion: 1.16 mmol/L (ref 1.15–1.40)
Chloride: 104 mmol/L (ref 98–111)
Glucose, Bld: 112 mg/dL — ABNORMAL HIGH (ref 70–99)
HCT: 27 % — ABNORMAL LOW (ref 36.0–46.0)
Hemoglobin: 9.2 g/dL — ABNORMAL LOW (ref 12.0–15.0)
Potassium: 4 mmol/L (ref 3.5–5.1)
Sodium: 139 mmol/L (ref 135–145)
TCO2: 24 mmol/L (ref 22–32)

## 2018-06-01 MED ORDER — ACETAMINOPHEN 325 MG PO TABS
650.0000 mg | ORAL_TABLET | Freq: Once | ORAL | Status: AC
  Administered 2018-06-01: 650 mg via ORAL
  Filled 2018-06-01: qty 2

## 2018-06-01 MED ORDER — ALBUTEROL SULFATE HFA 108 (90 BASE) MCG/ACT IN AERS
2.0000 | INHALATION_SPRAY | Freq: Once | RESPIRATORY_TRACT | Status: AC
  Administered 2018-06-01: 2 via RESPIRATORY_TRACT
  Filled 2018-06-01: qty 6.7

## 2018-06-01 NOTE — ED Notes (Signed)
Pt provided with turkey sandwich.

## 2018-06-01 NOTE — ED Notes (Signed)
Per Social worker: Pt can take prescriptions to [facility] health and wellness to get financial assistance with medications. Pt made aware. SW made PA aware

## 2018-06-01 NOTE — ED Notes (Signed)
Pt asking for food and bus passes during triage, this nurse explained she needs to be evaluated and cleared before we eat anything.
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
## 2018-06-01 NOTE — Care Management Note (Signed)
Case Management Note  CM consulted for medication assistance and no pcp.  CM spoke with Irene Pap, PA who advised pt has all of her medications and she will not be writing any new prescriptions today.  CM noted pt was [facility] to [facility] on 05/16/2018 by another ED CM.  CM spoke with pt at beside to discuss Marliss Coots, NP and the [facility].  Pt reports she has [facility] to [facility] and refuses to fill out their paperwork because she doesn't want to go through their "screening" and she's not staying in [place].  She states she will no qualify for their services.  CM even called Audrea Muscat on the phone with the pt to confirm she would qualify and the process is simple.  Pt remained adimate that she will not do it.  She reports she has 21 court charges that have all been dismissed and she is working with legal aid to have them exsponged.  She also reports that after her next court date on Nov 4th or 5th the judge told her they would give her her money back and she plans to go back to [place] or go to [place], but she also has to pay $175 to legal aid to help with the charges.  She additionally mentioned speaking with Marion Downer, the governor of [place] to assist with having the charges removed.  Pt asked about the other place that the CSW mentioned to her.  CM advised her she would have to make an appointment and establish care with a provider and a financial counseling appointment before she would be able to use the pharmacy at a discounted rate.  Pt is unwilling to do this since she is leaving in a month.  CM advised that CM had no other suggestions for her.  She thanked CM for the information.  Jacquelin Hawking, PA.  No further CM needs noted at this time.  Haunani Dickard, Benjaman Lobe, RN 06/01/2018, 10:25 AM

## 2018-06-01 NOTE — ED Notes (Signed)
Case manager at bedside 

## 2018-06-01 NOTE — Discharge Instructions (Signed)

## 2018-06-01 NOTE — Clinical Social Work Note (Signed)
Clinical Social Work Assessment  Patient Details  Name: Heather Graham MRN: [id number] Date of Birth: September 16, 1975  Date of referral:  06/01/18               Reason for consult:  Housing Concerns/Homelessness, Medication Concerns                Permission sought to share information with:    Permission granted to share information::     Name::        Agency::     Relationship::     Contact Information:     Housing/Transportation Living arrangements for the past 2 months:  Homeless Source of Information:  Patient Patient Interpreter Needed:  None Criminal Activity/Legal Involvement Pertinent to Current Situation/Hospitalization:  No - Comment as needed Significant Relationships:  Warehouse manager Lives with:  Self Do you feel safe going back to the place where you live?    Need for family participation in patient care:     Care giving concerns:  CSW consulted for assistance with medications. CSW aware patient is homeless. CSW to address homeless issues but will defer to RN CM for medication assistance.   Social Worker assessment / plan:  CSW spoke with patient at bedside regarding patient's current situation. Patient explained that she came to [place] from the [place] area, for a court date on 11/5. Per patient, she was originally staying with her mom but reports her mom asked her to leave. Patient states she has been homeless here for the past few days. Per patient, she stays between [facility] and the movie theater. CSW provided patient with a list of homeless shelter resources in the area and a list of places where she can get a warm meal. CSW also provided patient with information for the [employer]. Patient declined the resource as she does not want to be registered in "any system". Per patient, she is involved in a high profile case here and does not want her charges to continue to follow her. Patient reported that once she goes to her court date she is going  to move out [place]. Per patient, she does not like the [place] area and reports people here "treat her like crap". CSW has provided patient's RN with a bus pass once patient is discharged.   Employment status:  Unemployed Forensic scientist:  Self Pay (Medicaid Pending) PT Recommendations:    Information / Referral to community resources:  Shelter, Other (Comment Required)(Crisis resources, housing resources, food resources)  Patient/Family's Response to care:  Patient appreciative of CSW's resources. Per patient, she really just wants assistance with her medications. CSW to defer to RN CM.  Patient/Family's Understanding of and Emotional Response to Diagnosis, Current Treatment, and Prognosis:  Patient awaiting RN CM consult. Patient understanding that RN CM can only do so much and may not be able to assist with medications. Patient expressed understanding to CSW.   Emotional Assessment Appearance:  Appears stated age Attitude/Demeanor/Rapport:    Affect (typically observed):  Appropriate, Calm, Stable Orientation:  Oriented to Self, Oriented to Place, Oriented to  Time, Oriented to Situation Alcohol / Substance use:    Psych involvement (Current and /or in the community):     Discharge Needs  Concerns to be addressed:  Homelessness, Medication Concerns Readmission within the last 30 days:  No Current discharge risk:  Homeless Barriers to Discharge:  No Barriers Identified   Ollen Barges, [place] 06/01/2018, 8:24 AM

## 2018-06-01 NOTE — ED Notes (Signed)
Awaiting case management

## 2018-06-01 NOTE — ED Notes (Addendum)
Social Worker at bedside.

## 2018-06-01 NOTE — ED Provider Notes (Signed)
[facility] DEPT Provider Note   CSN: [id number] Arrival date & time: 06/01/18  0604     History   Chief Complaint Chief Complaint  Patient presents with  . Back Pain  . Vaginal Pain    HPI Heather Graham is a 42 y.o. female.  42 y.o female with a PMH Anemia, Asthma and GERD presents to the ED with multiple complaints.  Patient's chart patient was discharged from [facility] this morning, she was sent here on a cab ride. She reports pelvic cramping which has been going on for 10 years as she reports multiple fibroids.She was diagnosed with BV and given a prescription for metronidazole but reports she cannot afford it at this time as she is homeless. She also states she has asthma and was seen for this at [facility] hospital, she was provided with an inhaler. In addition, patient reports a long history of sciatica which she was treated for with steroids in the ED. She reports the pain on her lower back radiating to her right leg in sharp sensation. She also reports leg swelling which has been going on for some time, she states some places let her elevate her legs which seems to help with the swelling. She also states she does not live in [place] and is here for her court date in November. "No I don't have a primary care, no I don't have insurance and I need someone to pay for my medication". She denies any abdominal pain, chest pain or shortness of breath.      Past Medical History:  Diagnosis Date  . Anemia   . Asthma   . Back pain   . GERD (gastroesophageal reflux disease)   . Irregular heart beat     There are no active problems to display for this patient.   Past Surgical History:  Procedure Laterality Date  . TUBAL LIGATION       OB History    Gravida  2   Para  2   Term      Preterm      AB      Living  2     SAB      TAB      Ectopic      Multiple      Live Births               Home Medications      Prior to Admission medications   Medication Sig Start Date End Date Taking? Authorizing Provider  albuterol (PROVENTIL HFA;VENTOLIN HFA) 108 (90 Base) MCG/ACT inhaler Inhale 2 puffs into the lungs every 4 (four) hours as needed for wheezing or shortness of breath. 05/09/18  Yes Leftwich-Kirby, Kathie Dike, CNM  ferrous sulfate (FERROUSUL) 325 (65 FE) MG tablet Take 1 tablet (325 mg total) by mouth 2 (two) times daily. 05/09/18   Leftwich-Kirby, Kathie Dike, CNM  fluticasone (FLOVENT HFA) 110 MCG/ACT inhaler Inhale 2 puffs into the lungs 2 (two) times daily. 05/09/18   Leftwich-Kirby, Kathie Dike, CNM  megestrol (MEGACE) 40 MG tablet Take 1 tablet (40 mg total) by mouth 2 (two) times daily. 05/09/18   Leftwich-Kirby, Kathie Dike, CNM  metroNIDAZOLE (FLAGYL) 500 MG tablet Take 1 tablet (500 mg total) by mouth 2 (two) times daily. 05/09/18   Leftwich-Kirby, Kathie Dike, CNM  predniSONE (STERAPRED UNI-PAK 21 TAB) 10 MG (21) TBPK tablet Take by mouth daily. Take 6 tabs by mouth daily  for 1 days, then 5  tabs for 2 days, then 4 tabs for 2 days, then 3 tabs for 2 days, 2 tabs for 2 days, then 1 tab by mouth daily for 2 days 05/16/18   Delia Heady, PA-C    Family History No family history on file.  Social History Social History   Tobacco Use  . Smoking status: Never Smoker  . Smokeless tobacco: Never Used  Substance Use Topics  . Alcohol use: Yes  . Drug use: No     Allergies   Codeine; Penicillins; and Tramadol   Review of Systems Review of Systems  Constitutional: Negative for fever.  HENT: Negative for sore throat.   Respiratory: Negative for shortness of breath.   Cardiovascular: Positive for leg swelling. Negative for chest pain.  Gastrointestinal: Negative for abdominal pain, diarrhea, nausea and vomiting.  Genitourinary: Positive for vaginal bleeding, vaginal discharge and vaginal pain. Negative for dysuria and flank pain.  Musculoskeletal: Positive for back pain (chronic sciatica).  Skin: Negative for  pallor and wound.  Neurological: Negative for light-headedness and headaches.  All other systems reviewed and are negative.    Physical Exam Updated Vital Signs BP (!) 127/93   Pulse 79   Temp 98.2 F (36.8 C) (Oral)   Resp 16   LMP 05/18/2018   SpO2 100%   Physical Exam  Constitutional: She is oriented to person, place, and time. She appears well-developed and well-nourished.  Neck: Normal range of motion. Neck supple.  Cardiovascular: Normal heart sounds.  Pulmonary/Chest: Effort normal and breath sounds normal. She has no decreased breath sounds. She has no wheezes. She exhibits no bony tenderness.  Breath sounds throughout all lung fields.   Abdominal: Soft. There is no tenderness. There is no CVA tenderness.  Musculoskeletal: She exhibits edema ( BL edema, no calf tenderness BL). She exhibits no tenderness.       Right shoulder: She exhibits pain and spasm. She exhibits normal pulse and normal strength.       Arms: 1+ edema to BLE, no calf tenderness.  Neurological: She is alert and oriented to person, place, and time.  Skin: Skin is warm and dry.  Nursing note and vitals reviewed.    ED Treatments / Results  Labs (all labs ordered are listed, but only abnormal results are displayed) Labs Reviewed  I-STAT CHEM 8, ED - Abnormal; Notable for the following components:      Result Value   Glucose, Bld 112 (*)    Hemoglobin 9.2 (*)    HCT 27.0 (*)    All other components within normal limits    EKG None  Radiology No results found.  Procedures Procedures (including critical care time)  Medications Ordered in ED Medications  acetaminophen (TYLENOL) tablet 650 mg (650 mg Oral Given 06/01/18 0735)     Initial Impression / Assessment and Plan / ED Course  I have reviewed the triage vital signs and the nursing notes.  Pertinent labs & imaging results that were available during my care of the patient were reviewed by me and considered in my medical decision  making (see chart for details).   Patient presents with multiple chronic complaints. Discharged from [facility] hospital this morning. She reports she was given a prescription for her medications but does not have the income to afford it. She also reports a history of asthma, she was discharged with an inhaler and during my exam lungs were clear throughout all fields. She also reports a chronic history of sciatica which she was treated  for with a steroid prescription.  Istat chem 8 showed slight increase in glucose, electrolytes within normal limits., hemoglobin is 9.2 which is consistent with patient's previous visits. Patient is in good spirits waiting for case management, she has been provided with a sandwich and bus pass.  Patient appears in no distress, she is currently trying to get refill on medication for her BV. Case management and social work have spoken to patient and provided her with resources. Patient vitals are stable for discharge, patient stable for discharge.   9:59 AM patient is refusing all homeless resources as she reports she does not plan to stay in [place] and is going back to [place]. No further workup was indicated for any emergent condition.    Final Clinical Impressions(s) / ED Diagnoses   Final diagnoses:  Chronic midline low back pain with sciatica, sciatica laterality unspecified    ED Discharge Orders    None       Janeece Fitting, PA-C 06/01/18 1001    Julianne Rice, MD 06/02/18 [phone]

## 2018-06-01 NOTE — ED Triage Notes (Signed)
Pt arrived with complaints of feet swelling, back and cervix pain due to uterine fibroids, pt states she went to [facility] and was diagnosed with bacterial vaginosis and has been unable to get her medications due to homelessness. States this nurse "needs to put in a request for social work so someone can pay for her meds"

## 2018-06-01 NOTE — ED Notes (Signed)
ED Provider at bedside. 

## 2018-06-01 NOTE — MAU Provider Note (Signed)
Chief Complaint: Asthma   First Provider Initiated Contact with Patient 06/01/18 0439     SUBJECTIVE HPI: Heather Graham is a 42 y.o. non pregnant female who presents to Maternity Admissions reporting asthma issues. Has been to the ED & MAU 3 times this month for asthma complaints. States she needs her albuterol inhaler to get around d/t being homeless. Has been prescribed oral steroids & inhalers but can't afford them d/t her current financial situation. States tonight she was in an uncomfortable situation at the shelter & started feeling her asthma getting worse. Ran out of the inhaler she received last week & presents here for another inhaler. No CP. No wheezing. SOB when walking outside.  Pt states that she had previously been given Rx at the ED but she can't afford any of them. Reports that she was given rx for BV but she hasn't been able to get it filled. Requesting an injection to tx her BV.  Denies abdominal pain or vaginal bleeding.   Past Medical History:  Diagnosis Date  . Anemia   . Asthma   . Back pain   . GERD (gastroesophageal reflux disease)   . Irregular heart beat    OB History  Gravida Para Term Preterm AB Living  2 2       2   SAB TAB Ectopic Multiple Live Births               # Outcome Date GA Lbr Len/2nd Weight Sex Delivery Anes PTL Lv  2 Para           1 Para            Past Surgical History:  Procedure Laterality Date  . TUBAL LIGATION     Social History   Socioeconomic History  . Marital status: Divorced    Spouse name: Not on file  . Number of children: Not on file  . Years of education: Not on file  . Highest education level: Not on file  Occupational History  . Not on file  Social Needs  . Financial resource strain: Not on file  . Food insecurity:    Worry: Not on file    Inability: Not on file  . Transportation needs:    Medical: Not on file    Non-medical: Not on file  Tobacco Use  . Smoking status: Never Smoker  . Smokeless tobacco:  Never Used  Substance and Sexual Activity  . Alcohol use: Yes  . Drug use: No  . Sexual activity: Yes    Birth control/protection: Surgical  Lifestyle  . Physical activity:    Days per week: Not on file    Minutes per session: Not on file  . Stress: Not on file  Relationships  . Social connections:    Talks on phone: Not on file    Gets together: Not on file    Attends religious service: Not on file    Active member of club or organization: Not on file    Attends meetings of clubs or organizations: Not on file    Relationship status: Not on file  . Intimate partner violence:    Fear of current or ex partner: Not on file    Emotionally abused: Not on file    Physically abused: Not on file    Forced sexual activity: Not on file  Other Topics Concern  . Not on file  Social History Narrative  . Not on file   No family history on  file. No current facility-administered medications on file prior to encounter.    Current Outpatient Medications on File Prior to Encounter  Medication Sig Dispense Refill  . albuterol (PROVENTIL HFA;VENTOLIN HFA) 108 (90 Base) MCG/ACT inhaler Inhale 2 puffs into the lungs every 4 (four) hours as needed for wheezing or shortness of breath. 1 Inhaler 2  . ferrous sulfate (FERROUSUL) 325 (65 FE) MG tablet Take 1 tablet (325 mg total) by mouth 2 (two) times daily. 60 tablet 1  . fluticasone (FLOVENT HFA) 110 MCG/ACT inhaler Inhale 2 puffs into the lungs 2 (two) times daily. 1 Inhaler 12  . megestrol (MEGACE) 40 MG tablet Take 1 tablet (40 mg total) by mouth 2 (two) times daily. 60 tablet 11  . metroNIDAZOLE (FLAGYL) 500 MG tablet Take 1 tablet (500 mg total) by mouth 2 (two) times daily. 14 tablet 0  . predniSONE (STERAPRED UNI-PAK 21 TAB) 10 MG (21) TBPK tablet Take by mouth daily. Take 6 tabs by mouth daily  for 1 days, then 5 tabs for 2 days, then 4 tabs for 2 days, then 3 tabs for 2 days, 2 tabs for 2 days, then 1 tab by mouth daily for 2 days 36 tablet 0    Allergies  Allergen Reactions  . Codeine Other (See Comments)    Made very sleepy  . Penicillins Hives    Has patient had a PCN reaction causing immediate rash, facial/tongue/throat swelling, SOB or lightheadedness with hypotension: Yes Has patient had a PCN reaction causing severe rash involving mucus membranes or skin necrosis: Yes Has patient had a PCN reaction that required hospitalization No Has patient had a PCN reaction occurring within the last 10 years: No If all of the above answers are "NO", then may proceed with Cephalosporin use.   . Tramadol Hives    I have reviewed patient's Past Medical Hx, Surgical Hx, Family Hx, Social Hx, medications and allergies.   Review of Systems  Constitutional: Negative.   Respiratory: Positive for shortness of breath (not currently). Negative for chest tightness and wheezing.   Cardiovascular: Positive for leg swelling. Negative for chest pain.  Gastrointestinal: Negative for abdominal pain.  Genitourinary: Positive for vaginal discharge. Negative for vaginal bleeding.    OBJECTIVE Patient Vitals for the past 24 hrs:  BP Temp Temp src Pulse Resp SpO2 Height Weight  06/01/18 0423 132/80 98.3 F (36.8 C) Oral 100 20 99 % 5\' 6"  (1.676 m) (!) 145.7 kg   Constitutional: Well-developed, well-nourished female in no acute distress.  Cardiovascular: normal rate & rhythm, no murmur Respiratory: normal rate and effort. Lung sounds clear throughout Neurologic: Alert and oriented x 4.      LAB RESULTS No results found for this or any previous visit (from the past 24 hour(s)).  IMAGING No results found.  MAU COURSE Orders Placed This Encounter  Procedures  . Discharge patient   Meds ordered this encounter  Medications  . albuterol (PROVENTIL HFA;VENTOLIN HFA) 108 (90 Base) MCG/ACT inhaler 2 puff    MDM VSS, NAD. SpO2 100%. Lung sounds clear throughout.  Discussed with patient that BV is not tx with injection. Discussed good rx  coupons for previously prescribed meds.   ASSESSMENT 1. Mild persistent asthma without complication     PLAN Discharge home in stable condition. Pt previously given local resources for primary care & rx assistance Stressed importance of f/u for appropriate tx of asthma  Follow-up Information    [facility] Follow up.  Contact information: [place] [phone] [facility] DEPT Follow up.   Specialty:  Emergency Medicine Why:  If symptoms continue or worsen Contact information: [place] [id number] [place] [phone]         Allergies as of 06/01/2018      Reactions   Codeine Other (See Comments)   Made very sleepy   Penicillins Hives   Has patient had a PCN reaction causing immediate rash, facial/tongue/throat swelling, SOB or lightheadedness with hypotension: Yes Has patient had a PCN reaction causing severe rash involving mucus membranes or skin necrosis: Yes Has patient had a PCN reaction that required hospitalization No Has patient had a PCN reaction occurring within the last 10 years: No If all of the above answers are "NO", then may proceed with Cephalosporin use.   Tramadol Hives      Medication List    TAKE these medications   albuterol 108 (90 Base) MCG/ACT inhaler Commonly known as:  PROVENTIL HFA;VENTOLIN HFA Inhale 2 puffs into the lungs every 4 (four) hours as needed for wheezing or shortness of breath.   ferrous sulfate 325 (65 FE) MG tablet Take 1 tablet (325 mg total) by mouth 2 (two) times daily.   fluticasone 110 MCG/ACT inhaler Commonly known as:  FLOVENT HFA Inhale 2 puffs into the lungs 2 (two) times daily.   megestrol 40 MG tablet Commonly known as:  MEGACE Take 1 tablet (40 mg total) by mouth 2 (two) times daily.   metroNIDAZOLE 500 MG tablet Commonly known as:  FLAGYL Take 1  tablet (500 mg total) by mouth 2 (two) times daily.   predniSONE 10 MG (21) Tbpk tablet Commonly known as:  STERAPRED UNI-PAK 21 TAB Take by mouth daily. Take 6 tabs by mouth daily  for 1 days, then 5 tabs for 2 days, then 4 tabs for 2 days, then 3 tabs for 2 days, 2 tabs for 2 days, then 1 tab by mouth daily for 2 days        Jorje Guild, NP 06/01/2018  9:20 AM

## 2018-06-15 ENCOUNTER — Ambulatory Visit: Payer: Self-pay | Admitting: Obstetrics & Gynecology

## 2018-06-23 ENCOUNTER — Other Ambulatory Visit: Payer: Self-pay

## 2018-06-23 ENCOUNTER — Emergency Department
Admission: EM | Admit: 2018-06-23 | Discharge: 2018-06-24 | Disposition: A | Discharge status: Home / Self Care | Payer: Self-pay | Attending: Emergency Medicine | Admitting: Emergency Medicine

## 2018-06-23 ENCOUNTER — Emergency Department: Payer: Self-pay

## 2018-06-23 DIAGNOSIS — D259 Leiomyoma of uterus, unspecified: Secondary | ICD-10-CM | POA: Insufficient documentation

## 2018-06-23 DIAGNOSIS — N939 Abnormal uterine and vaginal bleeding, unspecified: Secondary | ICD-10-CM | POA: Insufficient documentation

## 2018-06-23 DIAGNOSIS — Z79899 Other long term (current) drug therapy: Secondary | ICD-10-CM | POA: Insufficient documentation

## 2018-06-23 DIAGNOSIS — J45909 Unspecified asthma, uncomplicated: Secondary | ICD-10-CM | POA: Insufficient documentation

## 2018-06-23 DIAGNOSIS — D649 Anemia, unspecified: Secondary | ICD-10-CM | POA: Insufficient documentation

## 2018-06-23 LAB — COMPREHENSIVE METABOLIC PANEL
ALBUMIN: 3.2 g/dL — AB (ref 3.5–5.0)
ALT: 22 U/L (ref 0–44)
ANION GAP: 7 (ref 5–15)
AST: 22 U/L (ref 15–41)
Alkaline Phosphatase: 80 U/L (ref 38–126)
BILIRUBIN TOTAL: 0.5 mg/dL (ref 0.3–1.2)
BUN: 13 mg/dL (ref 6–20)
CO2: 25 mmol/L (ref 22–32)
Calcium: 8.3 mg/dL — ABNORMAL LOW (ref 8.9–10.3)
Chloride: 106 mmol/L (ref 98–111)
Creatinine, Ser: 0.91 mg/dL (ref 0.44–1.00)
GFR calc Af Amer: >60 mL/min (ref >60)
GFR calc non Af Amer: >60 mL/min (ref >60)
GLUCOSE: 105 mg/dL — AB (ref 70–99)
POTASSIUM: 3.7 mmol/L (ref 3.5–5.1)
SODIUM: 138 mmol/L (ref 135–145)
TOTAL PROTEIN: 6.9 g/dL (ref 6.5–8.1)

## 2018-06-23 LAB — CBC
HCT: 28.3 % — ABNORMAL LOW (ref 36.0–46.0)
Hemoglobin: 8.3 g/dL — ABNORMAL LOW (ref 12.0–15.0)
MCH: 22 pg — ABNORMAL LOW (ref 26.0–34.0)
MCHC: 29.3 g/dL — AB (ref 30.0–36.0)
MCV: 75.1 fL — ABNORMAL LOW (ref 80.0–100.0)
NRBC: 0 % (ref 0.0–0.2)
Platelets: 437 10*3/uL — ABNORMAL HIGH (ref 150–400)
RBC: 3.77 MIL/uL — ABNORMAL LOW (ref 3.87–5.11)
RDW: 17.4 % — AB (ref 11.5–15.5)
WBC: 6.4 10*3/uL (ref 4.0–10.5)

## 2018-06-23 LAB — TYPE AND SCREEN
ABO/RH(D): O POS
Antibody Screen: NEGATIVE

## 2018-06-23 LAB — HCG, QUANTITATIVE, PREGNANCY: hCG, Beta Chain, Quant, S: <1 m[IU]/mL (ref <5)

## 2018-06-23 MED ORDER — METHYLERGONOVINE MALEATE 0.2 MG/ML IJ SOLN
0.2000 mg | Freq: Once | INTRAMUSCULAR | Status: AC
  Administered 2018-06-23: 0.2 mg via INTRAMUSCULAR
  Filled 2018-06-23: qty 1

## 2018-06-23 MED ORDER — KETOROLAC TROMETHAMINE 30 MG/ML IJ SOLN
30.0000 mg | Freq: Once | INTRAMUSCULAR | Status: AC
  Administered 2018-06-23: 30 mg via INTRAVENOUS
  Filled 2018-06-23: qty 1

## 2018-06-23 MED ORDER — FERROUS SULFATE 325 (65 FE) MG PO TABS: 325.0000 mg | ORAL_TABLET | Freq: Every day | ORAL | 2 refills | Status: DC

## 2018-06-23 NOTE — ED Triage Notes (Signed)
Pt. Arrived via [facility] with complaints of vaginal bleeding due to uterine fibroids.  Pt. States the bleeding started last night.  She states she has had to change her pad/tampon more than 10 times today due to the heavy bleeding.  She states she is also passing large blood clots. [facility]  Patient has a history of asthma and uses albuterol.

## 2018-06-23 NOTE — Discharge Instructions (Signed)
Please take ibuprofen or Tylenol as needed for discomfort as written on the box.  Please begin taking your iron supplements each day as prescribed.  Please follow-up with your OB/GYN at the women's center.  Return to the emergency department for any symptoms personally concerning to yourself.

## 2018-06-23 NOTE — ED Provider Notes (Signed)
[facility] Emergency Department Provider Note  Time seen: 9:43 PM  I have reviewed the triage vital signs and the nursing notes.   HISTORY  Chief Complaint Vaginal Bleeding    HPI Heather Graham is a 42 y.o. female with a past medical history of anemia, asthma, back pain, gastric reflux, menorrhagia, fibroids, presents to the emergency department for heavy vaginal bleeding.  According to the patient every month she has very heavy bleeding for approximately 3 days as well as painful lower abdominal cramping.  Patient states her period started yesterday, states she has gone through approximately 10-15 pads over 24-hour period.  Patient states moderate lower abdominal cramping as well.  States she has heavy bleeding every month, was prescribed OCPs to try to help regulate the bleeding but the patient states she cannot afford them.  [facility] is [facility] a [facility].  Patient denies any lightheadedness or syncope today however does state generalized weakness.   Past Medical History:  Diagnosis Date  . Anemia   . Asthma   . Back pain   . GERD (gastroesophageal reflux disease)   . Irregular heart beat     There are no active problems to display for this patient.   Past Surgical History:  Procedure Laterality Date  . TUBAL LIGATION      Prior to Admission medications   Medication Sig Start Date End Date Taking? Authorizing Provider  albuterol (PROVENTIL HFA;VENTOLIN HFA) 108 (90 Base) MCG/ACT inhaler Inhale 2 puffs into the lungs every 4 (four) hours as needed for wheezing or shortness of breath. 05/09/18   Leftwich-Kirby, Kathie Dike, CNM  ferrous sulfate (FERROUSUL) 325 (65 FE) MG tablet Take 1 tablet (325 mg total) by mouth 2 (two) times daily. 05/09/18   Leftwich-Kirby, Kathie Dike, CNM  fluticasone (FLOVENT HFA) 110 MCG/ACT inhaler Inhale 2 puffs into the lungs 2 (two) times daily. 05/09/18   Leftwich-Kirby, Kathie Dike, CNM  megestrol (MEGACE) 40 MG  tablet Take 1 tablet (40 mg total) by mouth 2 (two) times daily. 05/09/18   Leftwich-Kirby, Kathie Dike, CNM  metroNIDAZOLE (FLAGYL) 500 MG tablet Take 1 tablet (500 mg total) by mouth 2 (two) times daily. 05/09/18   Leftwich-Kirby, Kathie Dike, CNM  predniSONE (STERAPRED UNI-PAK 21 TAB) 10 MG (21) TBPK tablet Take by mouth daily. Take 6 tabs by mouth daily  for 1 days, then 5 tabs for 2 days, then 4 tabs for 2 days, then 3 tabs for 2 days, 2 tabs for 2 days, then 1 tab by mouth daily for 2 days 05/16/18   Delia Heady, PA-C    Allergies  Allergen Reactions  . Codeine Other (See Comments)    Made very sleepy  . Penicillins Hives    Has patient had a PCN reaction causing immediate rash, facial/tongue/throat swelling, SOB or lightheadedness with hypotension: Yes Has patient had a PCN reaction causing severe rash involving mucus membranes or skin necrosis: Yes Has patient had a PCN reaction that required hospitalization No Has patient had a PCN reaction occurring within the last 10 years: No If all of the above answers are "NO", then may proceed with Cephalosporin use.   . Tramadol Hives    History reviewed. No pertinent family history.  Social History Social History   Tobacco Use  . Smoking status: Never Smoker  . Smokeless tobacco: Never Used  Substance Use Topics  . Alcohol use: Yes  . Drug use: No    Review of Systems Constitutional: Negative  for fever.  Positive for generalized weakness. Cardiovascular: Negative for chest pain. Respiratory: Negative for shortness of breath. Gastrointestinal: Negative for abdominal pain, vomiting Genitourinary: Positive for vaginal bleeding.  Patient states status post tubal ligation. Musculoskeletal: Negative for musculoskeletal complaints Skin: Negative for skin complaints  Neurological: Negative for headache All other ROS negative  ____________________________________________   PHYSICAL EXAM:  VITAL SIGNS: ED Triage Vitals  Enc Vitals Group      BP --      Pulse Rate 06/23/18 2053 75     Resp --      Temp 06/23/18 2053 98.2 F (36.8 C)     Temp Source 06/23/18 2053 Oral     SpO2 06/23/18 2053 98 %     Weight 06/23/18 2055 (!) 324 lb (147 kg)     Height 06/23/18 2055 5\' 5"  (1.651 m)     Head Circumference --      Peak Flow --      Pain Score 06/23/18 2054 10     Pain Loc --      Pain Edu? --      Excl. in [place]? --    Constitutional: Alert and oriented. Well appearing and in no distress. Eyes: Normal exam ENT   Head: Normocephalic and atraumatic.   Mouth/Throat: Mucous membranes are moist. Cardiovascular: Normal rate, regular rhythm. No murmur Respiratory: Normal respiratory effort without tachypnea nor retractions. Breath sounds are clear Gastrointestinal: Soft, mild lower abdominal tenderness to palpation.  Obese.  No rebound guarding or distention. Musculoskeletal: Nontender with normal range of motion in all extremities.  Neurologic:  Normal speech and language. No gross focal neurologic deficits  Skin:  Skin is warm, dry and intact.  Psychiatric: Mood and affect are normal.  ____________________________________________  EKG reviewed and interpreted by myself shows normal sinus rhythm at 73 bpm with a narrow QRS, normal axis, normal intervals, no concerning ST changes.  INITIAL IMPRESSION / ASSESSMENT AND PLAN / ED COURSE  Pertinent labs & imaging results that were available during my care of the patient were reviewed by me and considered in my medical decision making (see chart for details).  Patient presents emergency department for lower abdominal cramping and vaginal bleeding.  States every month she has heavy vaginal bleeding and oftentimes will have to take OCPs to help regulate the bleeding although the patient states she does not have any currently because she cannot afford to get her prescription filled.  Patient states proximate 10-15 pads over the past 24 hours.  Patient has a documented history of  fibroids as well as anemia.  We will check labs, type and screen.  We will proceed with an ultrasound to further evaluate.  We will dose 0.2 mg of IM Methergine and reassess.  Patient's lab work has resulted showing fairly significant anemia however compared to her baseline it is largely unchanged.  Patient has a lower MCV most consistent with iron deficiency anemia we will place back on iron supplementation.  Patient's pregnancy test is negative.  Ultrasound is pending.  We will dose Methergine and continue to closely monitor.  We will dose Toradol for abdominal cramping.  Ultrasound pending.  Patient care signed out to Dr. Owens Shark.  If ultrasound is within normal limits patient will be discharged home with OB/GYN follow-up which the patient already has established.  ____________________________________________   FINAL CLINICAL IMPRESSION(S) / ED DIAGNOSES  Menorrhagia Abdominal cramping    Harvest Dark, MD 06/23/18 2312

## 2018-06-23 NOTE — ED Notes (Signed)
Pt given ginger ale. Pts lights in room turned off. Pt resting at this time. No other needs voiced at this time.

## 2018-06-23 NOTE — ED Notes (Signed)
Pt returned from US

## 2018-06-23 NOTE — ED Notes (Signed)
Pt. Transported to US

## 2018-06-24 MED ORDER — FERROUS SULFATE 325 (65 FE) MG PO TABS: 325.0000 mg | ORAL_TABLET | Freq: Every day | ORAL | 2 refills | Status: AC

## 2018-06-24 MED ORDER — ALBUTEROL SULFATE HFA 108 (90 BASE) MCG/ACT IN AERS: 2.0000 | INHALATION_SPRAY | Freq: Four times a day (QID) | RESPIRATORY_TRACT | 2 refills | Status: AC | PRN

## 2018-06-24 NOTE — ED Provider Notes (Signed)
Assumed care of the patient at 11:00 PM from Dr. Kerman Passey with recommendation to follow-up with ultrasound which revealed multiple uterine fibroids.  Patient notified of all clinical findings and discharge home as directed.   Gregor Hams, MD 06/24/18 [phone]

## 2018-12-20 IMAGING — US US TRANSVAGINAL NON-OB
1 series · 13 of 25 positions shown · non-contrast
Comparison: CT abdomen and pelvis 05/28/2016. Pelvic ultrasound
12/31/2015.

CLINICAL DATA: Heavy vaginal bleeding since last night. History of
uterine fibroids.

EXAM:
TRANSABDOMINAL AND TRANSVAGINAL ULTRASOUND OF PELVIS
TECHNIQUE: Both transabdominal and transvaginal ultrasound examinations of the
pelvis were performed. Transabdominal technique was performed for
global imaging of the pelvis including uterus, ovaries, adnexal
regions, and pelvic cul-de-sac. It was necessary to proceed with
endovaginal exam following the transabdominal exam to visualize the
endometrium and ovaries.

[Series 1: us transvaginal non-ob · 0.24mm/px · 13 of 88 slices shown]
[im 1/88]
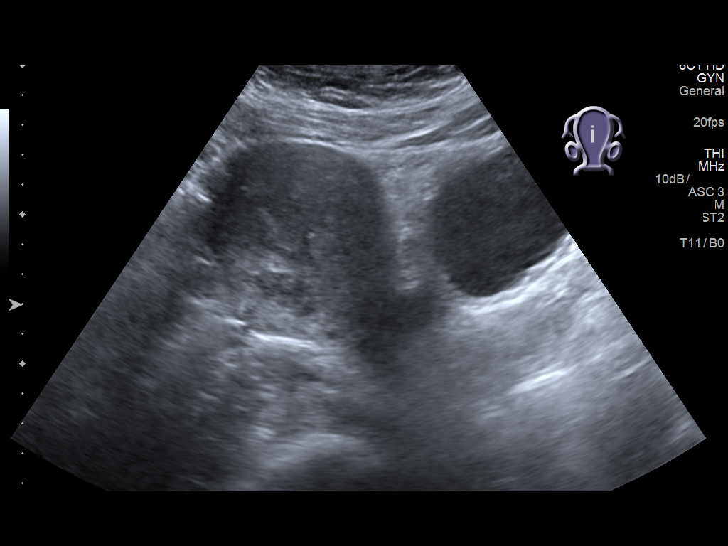
[im 8/88]
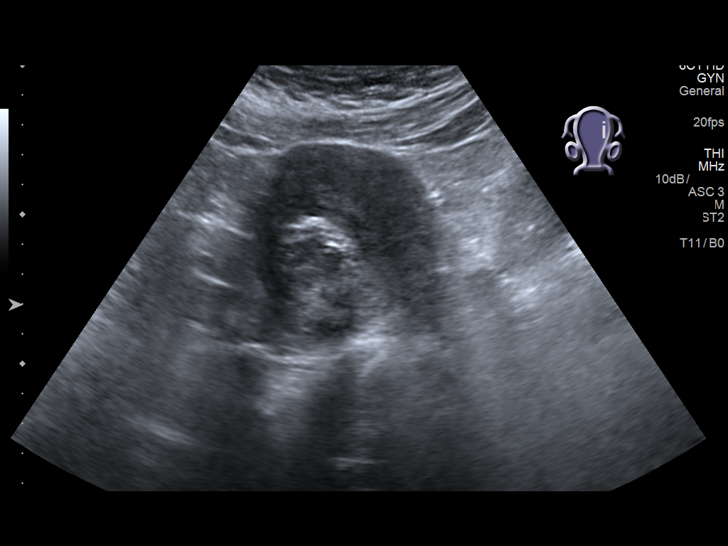
[im 15/88]
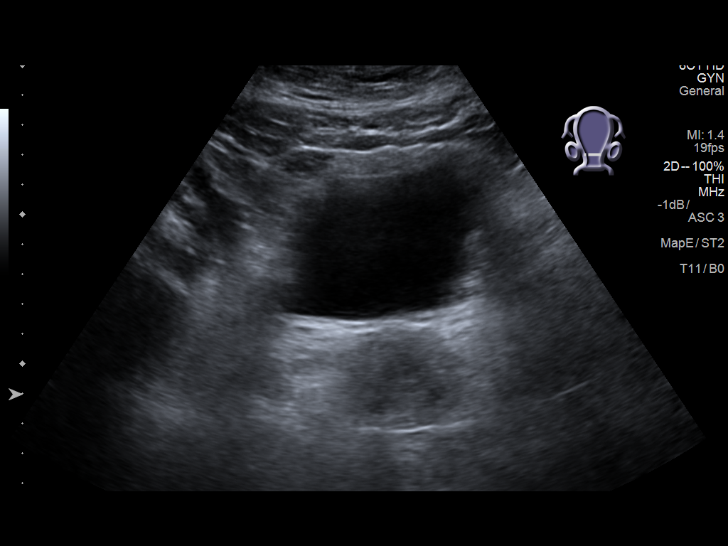
[im 22/88]
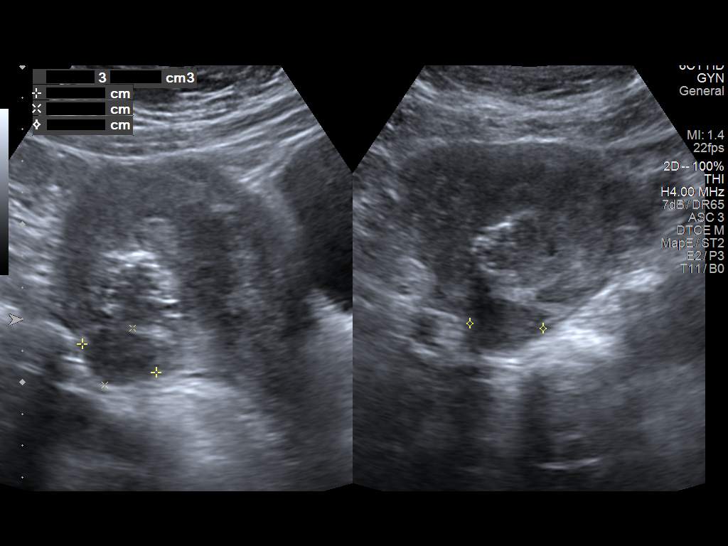
[im 30/88]
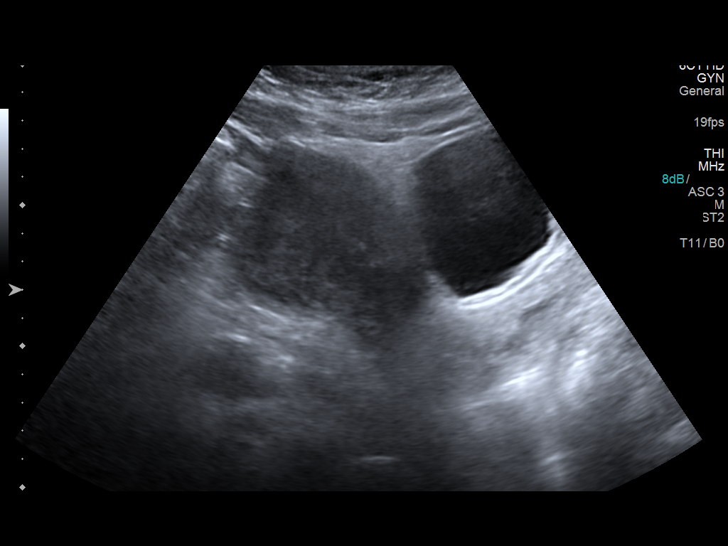
[im 37/88]
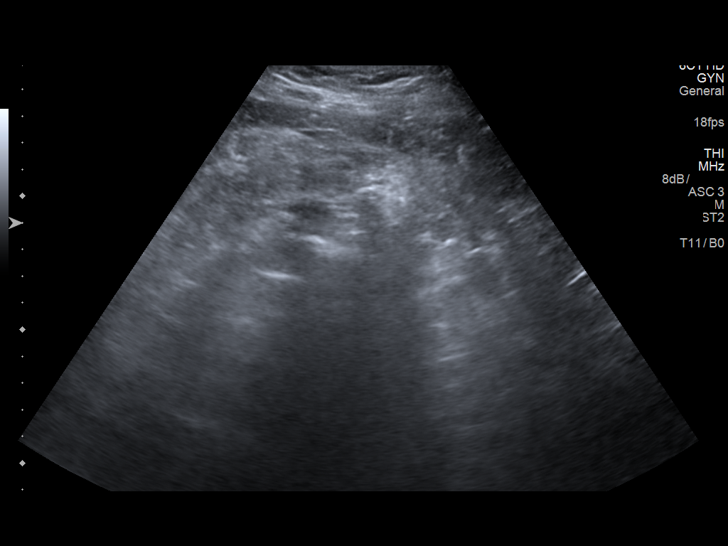
[im 44/88]
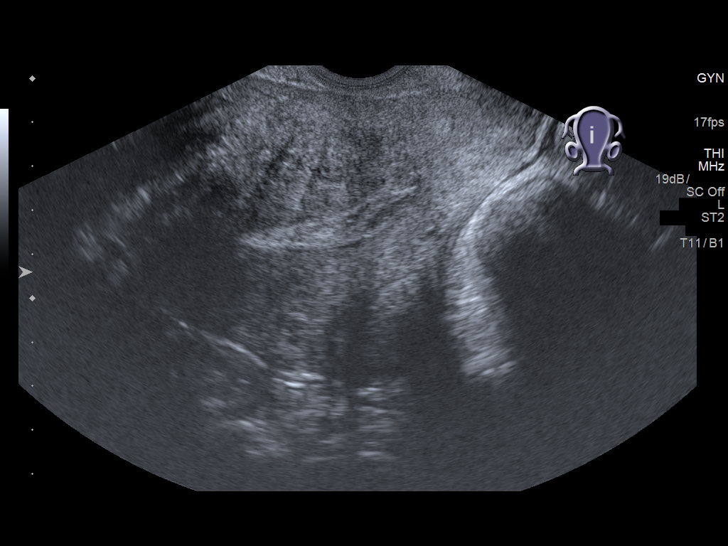
[im 51/88]
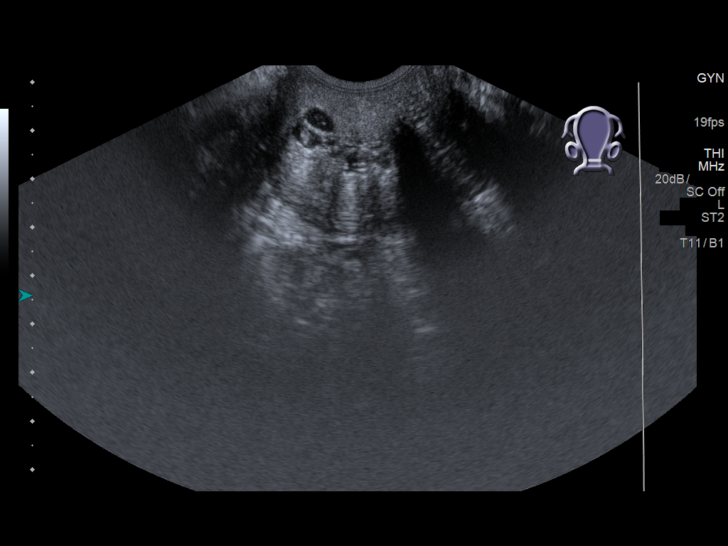
[im 59/88]
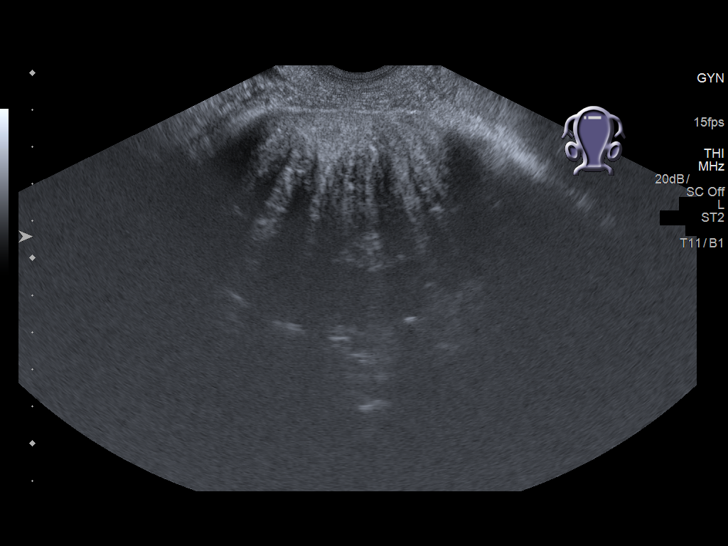
[im 66/88]
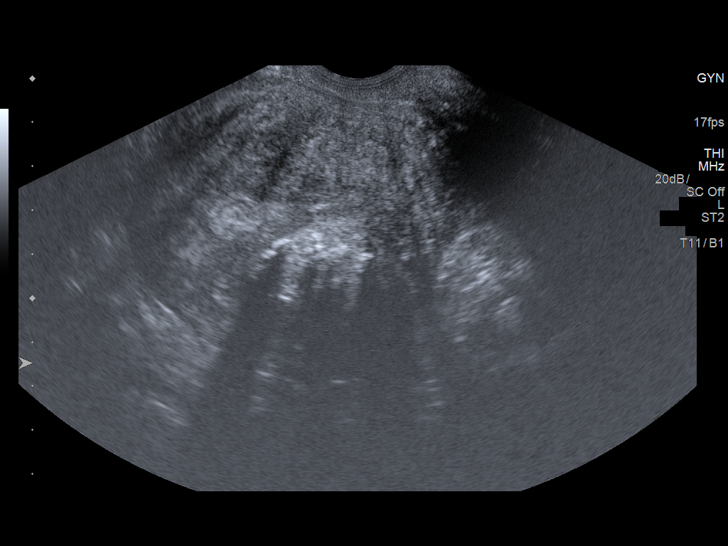
[im 73/88]
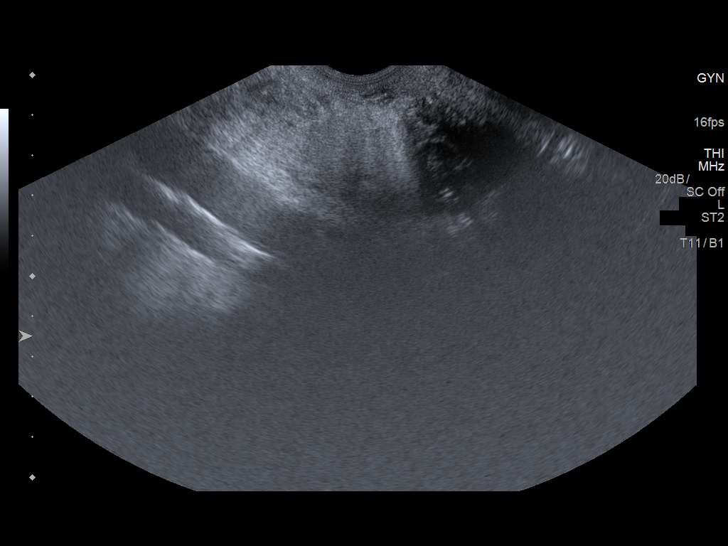
[im 80/88]
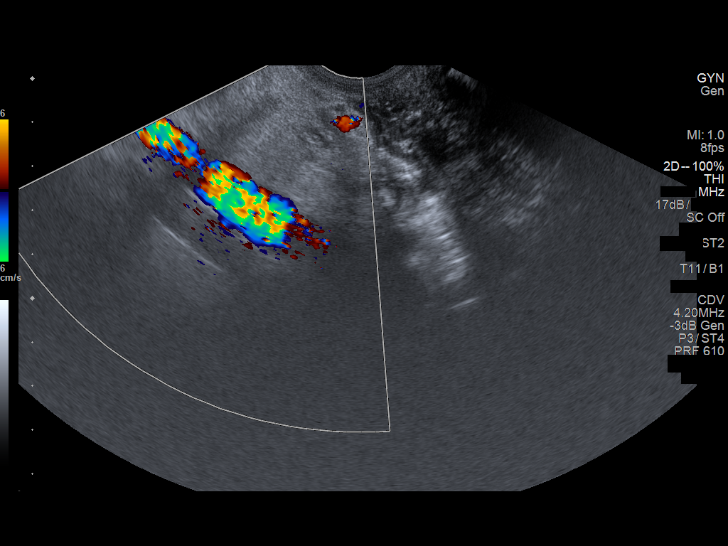
[im 88/88]
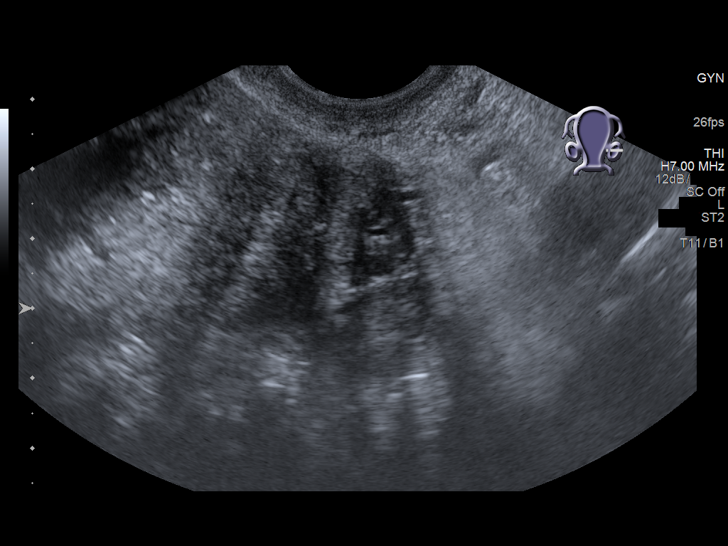

[13 of 25 positions shown; findings below may reference images not displayed]

FINDINGS: Uterus

Measurements: 11.7 x 6.6 x 7.7 cm = volume: 308 mL. Multiple uterine
fibroids were again seen with the largest being a calcified
intramural fibroid posteriorly in the uterine body measuring 3.1 x
2.8 x 3.4 cm. A 2.7 x 2.8 x 3.0 cm intramural fibroid was noted
anteriorly in the right anterior uterine body, and there is a 2.7 x
2.2 x 2.7 cm subserosal fibroid in the posterior uterine body.

Endometrium

Thickness: 9 mm.  No focal abnormality visualized.

Right ovary

Not visualized.

Left ovary

Measurements: 2.3 x 1.1 x 1.7 cm = volume: 2.1 mL. Normal
appearance/no adnexal mass.

Other findings

No abnormal free fluid.
IMPRESSION: 1. Multiple uterine fibroids measuring up to 3.4 cm in size.
2. Normal endometrial thickness.
3. Unremarkable left ovary.  Nonvisualization of the right ovary.

## 7363-11-17 DEATH — deceased
